# Patient Record
Sex: Male | Born: 1970
Health system: Southern US, Community
[De-identification: ages and names within clinical notes are randomized; demographics above are authoritative.]

## PROBLEM LIST (undated history)

## (undated) DIAGNOSIS — S31119A Laceration without foreign body of abdominal wall, unspecified quadrant without penetration into peritoneal cavity, initial encounter: Secondary | ICD-10-CM

## (undated) DIAGNOSIS — I2699 Other pulmonary embolism without acute cor pulmonale: Secondary | ICD-10-CM

## (undated) DIAGNOSIS — F1490 Cocaine use, unspecified, uncomplicated: Secondary | ICD-10-CM

## (undated) DIAGNOSIS — F1010 Alcohol abuse, uncomplicated: Secondary | ICD-10-CM

## (undated) HISTORY — PX: SKIN GRAFT: SHX250

---

## 2018-01-21 ENCOUNTER — Encounter (HOSPITAL_BASED_OUTPATIENT_CLINIC_OR_DEPARTMENT_OTHER): Payer: Self-pay | Admitting: Emergency Medicine

## 2018-01-21 ENCOUNTER — Emergency Department (HOSPITAL_BASED_OUTPATIENT_CLINIC_OR_DEPARTMENT_OTHER)
Admission: EM | Admit: 2018-01-21 | Discharge: 2018-01-21 | Disposition: A | Discharge status: Home / Self Care | Payer: Self-pay | Attending: Emergency Medicine | Admitting: Emergency Medicine

## 2018-01-21 ENCOUNTER — Other Ambulatory Visit: Payer: Self-pay

## 2018-01-21 ENCOUNTER — Emergency Department (HOSPITAL_BASED_OUTPATIENT_CLINIC_OR_DEPARTMENT_OTHER): Payer: Self-pay

## 2018-01-21 DIAGNOSIS — Z79899 Other long term (current) drug therapy: Secondary | ICD-10-CM | POA: Insufficient documentation

## 2018-01-21 DIAGNOSIS — R21 Rash and other nonspecific skin eruption: Secondary | ICD-10-CM | POA: Insufficient documentation

## 2018-01-21 DIAGNOSIS — F17210 Nicotine dependence, cigarettes, uncomplicated: Secondary | ICD-10-CM | POA: Insufficient documentation

## 2018-01-21 LAB — COMPREHENSIVE METABOLIC PANEL
ALT: 109 U/L — ABNORMAL HIGH (ref 17–63)
ANION GAP: 10 (ref 5–15)
AST: 63 U/L — ABNORMAL HIGH (ref 15–41)
Albumin: 3.5 g/dL (ref 3.5–5.0)
Alkaline Phosphatase: 228 U/L — ABNORMAL HIGH (ref 38–126)
BILIRUBIN TOTAL: 2.2 mg/dL — AB (ref 0.3–1.2)
BUN: 10 mg/dL (ref 6–20)
CO2: 22 mmol/L (ref 22–32)
Calcium: 9 mg/dL (ref 8.9–10.3)
Chloride: 99 mmol/L — ABNORMAL LOW (ref 101–111)
Creatinine, Ser: 0.84 mg/dL (ref 0.61–1.24)
GLUCOSE: 121 mg/dL — AB (ref 65–99)
POTASSIUM: 4.4 mmol/L (ref 3.5–5.1)
Sodium: 131 mmol/L — ABNORMAL LOW (ref 135–145)
TOTAL PROTEIN: 8.4 g/dL — AB (ref 6.5–8.1)

## 2018-01-21 LAB — CBC WITH DIFFERENTIAL/PLATELET
BASOS ABS: 0 10*3/uL (ref 0.0–0.1)
Basophils Relative: 0 %
EOS ABS: 0.4 10*3/uL (ref 0.0–0.7)
Eosinophils Relative: 4 %
HCT: 39.9 % (ref 39.0–52.0)
HEMOGLOBIN: 13.7 g/dL (ref 13.0–17.0)
LYMPHS PCT: 6 %
Lymphs Abs: 0.6 10*3/uL — ABNORMAL LOW (ref 0.7–4.0)
MCH: 32.1 pg (ref 26.0–34.0)
MCHC: 34.3 g/dL (ref 30.0–36.0)
MCV: 93.4 fL (ref 78.0–100.0)
MONOS PCT: 7 %
Monocytes Absolute: 0.7 10*3/uL (ref 0.1–1.0)
NEUTROS PCT: 83 %
Neutro Abs: 7.7 10*3/uL (ref 1.7–7.7)
Platelets: 173 10*3/uL (ref 150–400)
RBC: 4.27 MIL/uL (ref 4.22–5.81)
RDW: 13.7 % (ref 11.5–15.5)
WBC MORPHOLOGY: INCREASED
WBC: 9.4 10*3/uL (ref 4.0–10.5)

## 2018-01-21 MED ORDER — ACETAMINOPHEN 325 MG PO TABS
650.0000 mg | ORAL_TABLET | Freq: Once | ORAL | Status: AC
  Administered 2018-01-21: 650 mg via ORAL
  Filled 2018-01-21: qty 2
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
## 2018-01-21 NOTE — ED Provider Notes (Signed)
[facility] EMERGENCY DEPARTMENT Provider Note   CSN: [id number] Arrival date & time: 01/21/18  0915     History   Chief Complaint Chief Complaint  Patient presents with  . Rash    HPI Horatio PelJohn Waxman is a 47 y.o. male here for a rash  RASH  Had rash for 2 days. Location: throughout body, not on genitals, palms, or soles Medications tried: nothing Similar rash in past: no Patient believes may be caused by unsure He is currently staying at [facility].  Initially he thought that the rash and pain associated with it was because he was withdrawing.  New medications or antibiotics: yes, he is on antibiotics, he doesn't know which though.  Prescribed a 10-day course of antibiotics after getting laceration sutured on his head.  He is on day 9 of 10. Tick, Insect or new pet exposure: no Recent travel: no New detergent or soap: no Immunocompromised: no  Symptoms Itching: mild Pain over rash: yes Feeling ill all over: On Sunday he felt like his whole body hurt Fever: no Mouth sores: no Face or tongue swelling: no Trouble breathing: no Joint swelling or pain: no He is endorsing a cough  HPI  History reviewed. No pertinent past medical history.  There are no active problems to display for this patient.   History reviewed. No pertinent surgical history.      Home Medications    Prior to Admission medications   Medication Sig Start Date End Date Taking? Authorizing Provider  gabapentin (NEURONTIN) 100 MG capsule Take 100 mg by mouth 3 (three) times daily.   Yes [provider]  levETIRAcetam (KEPPRA) 500 MG tablet Take 500 mg by mouth 2 (two) times daily.   Yes [provider]  sulfamethoxazole-trimethoprim (BACTRIM DS,SEPTRA DS) 800-160 MG tablet Take 1 tablet by mouth 2 (two) times daily.   Yes [provider]  traZODone (DESYREL) 100 MG tablet Take 100 mg by mouth at bedtime.   Yes  [provider]    Family History History reviewed. No pertinent family history.  Social History Social History   Tobacco Use  . Smoking status: Current Every Day Smoker    Packs/day: 1.00    Types: Cigarettes  . Smokeless tobacco: Never Used  Substance Use Topics  . Alcohol use: Not on file  . Drug use: Not on file     Allergies   Patient has no known allergies.   Review of Systems Review of Systems  Constitutional: Negative for fatigue.  HENT: Negative for congestion and rhinorrhea.   Eyes: Negative for redness.  Respiratory: Negative for chest tightness, shortness of breath and wheezing.   Cardiovascular: Negative for chest pain.  Gastrointestinal: Negative for abdominal pain, constipation, diarrhea, nausea and vomiting.  Genitourinary: Negative for genital sores.  Musculoskeletal: Negative for arthralgias and joint swelling.  Skin: Positive for rash.    Physical Exam Updated Vital Signs BP 112/60 (BP Location: Right Arm)   Pulse 79   Temp 100 F (37.8 C) (Oral)   Resp 15   Ht 6\' 4"  (1.93 m)   Wt 90.7 kg (200 lb)   SpO2 100%   BMI 24.34 kg/m   Physical Exam  Constitutional: He is oriented to person, place, and time. He appears well-developed and well-nourished.  HENT:  Head: Normocephalic.  Right Ear: External ear normal.  Left Ear: External ear normal.  Nose: Nose normal.  Mouth/Throat: Oropharynx is clear and moist.  Eyes: Pupils are equal, round, and reactive to light. Conjunctivae are normal.  Neck: Normal range of motion.  Cardiovascular: Normal rate and regular rhythm.  Pulmonary/Chest: Effort normal and breath sounds normal.  Abdominal: Soft. Bowel sounds are normal.  Musculoskeletal: Normal range of motion. He exhibits no edema.  Neurological: He is alert and oriented to person, place, and time. He exhibits normal muscle tone.  Skin: Skin is warm and dry. Capillary refill takes less than 2 seconds. Rash noted. Rash is papular  (Scattered erythematous papules throughout body, most pronounced on head and torso.  No rash on palms or soles or mucous membranes).     ED Treatments / Results  Labs (all labs ordered are listed, but only abnormal results are displayed) Labs Reviewed  COMPREHENSIVE METABOLIC PANEL - Abnormal; Notable for the following components:      Result Value   Sodium 131 (*)    Chloride 99 (*)    Glucose, Bld 121 (*)    Total Protein 8.4 (*)    AST 63 (*)    ALT 109 (*)    Alkaline Phosphatase 228 (*)    Total Bilirubin 2.2 (*)    All other components within normal limits  CBC WITH DIFFERENTIAL/PLATELET - Abnormal; Notable for the following components:   Lymphs Abs 0.6 (*)    All other components within normal limits  HIV ANTIBODY (ROUTINE TESTING)  RPR  HEPATITIS PANEL, ACUTE    EKG None  Radiology Dg Chest 2 View  Result Date: 01/21/2018 CLINICAL DATA:  Cough.  Rash. EXAM: CHEST - 2 VIEW COMPARISON:  01/21/2018 FINDINGS: The heart size and mediastinal contours are within normal limits. Both lungs are clear except for slight peribronchial thickening on the lateral view. The visualized skeletal structures are unremarkable. IMPRESSION: Mild bronchitic changes. Electronically Signed   By: Francene Boyers M.D.   On: 01/21/2018 10:19    Procedures Procedures (including critical care time)  Medications Ordered in ED Medications  acetaminophen (TYLENOL) tablet 650 mg (650 mg Oral Given 01/21/18 1039)     Initial Impression / Assessment and Plan / ED Course  I have reviewed the triage vital signs and the nursing notes.  Pertinent labs & imaging results that were available during my care of the patient were reviewed by me and considered in my medical decision making (see chart for details).     47 year old male here for diffuse erythematous papular rash for 2 days. Possible low grade fever with a temperature of 100, otherwise vitals are normal. Could be a drug exanthem from being on  Bactrim however his rash is not a typical presentation of this. Rash does not appear to be involving the mucous membranes so no concern for Stevens-Johnson. Other possibilities include an infectious process; ?folliculitis vs HIV.  HIV, RPR, CBC, CMP collected.  CMP showing elevated LFTs and T bili, likely secondary to prior alcohol abuse.  Hepatitis panel collected.    Discussed with patient that his rash could be from drug exanthem and he will need to discontinue his Bactrim immediately.  He will take ibuprofen as needed for any pain.  Strict return precautions discussed.    Final Clinical Impressions(s) / ED Diagnoses   Final diagnoses:  Rash    ED Discharge Orders    None       Beaulah Dinning, MD 01/21/18 1244    Pricilla Loveless, MD 01/21/18 1525

## 2018-01-21 NOTE — Discharge Instructions (Addendum)
You were seen today for rash on your skin.  We are not 100% sure what is causing this rash however it is very likely that it could be a reaction to the antibiotics you are on.  You will need to stop the antibiotics immediately.  You can take ibuprofen as needed for any pain.  Please follow-up if the rash does not get better over the next few days, if you have a fever, chills, nausea, vomiting.

## 2018-01-22 ENCOUNTER — Encounter (HOSPITAL_COMMUNITY): Payer: Self-pay | Admitting: Emergency Medicine

## 2018-01-22 ENCOUNTER — Emergency Department (HOSPITAL_COMMUNITY)
Admission: EM | Admit: 2018-01-22 | Discharge: 2018-01-22 | Disposition: A | Discharge status: Home / Self Care | Payer: Self-pay | Attending: Emergency Medicine | Admitting: Emergency Medicine

## 2018-01-22 ENCOUNTER — Other Ambulatory Visit: Payer: Self-pay

## 2018-01-22 DIAGNOSIS — R21 Rash and other nonspecific skin eruption: Secondary | ICD-10-CM | POA: Insufficient documentation

## 2018-01-22 DIAGNOSIS — Z79899 Other long term (current) drug therapy: Secondary | ICD-10-CM | POA: Insufficient documentation

## 2018-01-22 DIAGNOSIS — F17210 Nicotine dependence, cigarettes, uncomplicated: Secondary | ICD-10-CM | POA: Insufficient documentation

## 2018-01-22 LAB — HIV ANTIBODY (ROUTINE TESTING W REFLEX): HIV SCREEN 4TH GENERATION: NONREACTIVE

## 2018-01-22 LAB — RPR: RPR Ser Ql: NONREACTIVE

## 2018-01-22 LAB — HEPATITIS PANEL, ACUTE
HCV Ab: <0.1 s/co ratio (ref 0.0–0.9)
HEP B C IGM: NEGATIVE
Hep A IgM: NEGATIVE
Hepatitis B Surface Ag: NEGATIVE

## 2018-01-22 MED ORDER — PREDNISONE 10 MG PO TABS: ORAL_TABLET | ORAL | 0 refills | Status: AC

## 2018-01-22 NOTE — ED Notes (Signed)
Dr. Wentz at bedside. 

## 2018-01-22 NOTE — ED Provider Notes (Signed)
[facility] EMERGENCY DEPARTMENT Provider Note   CSN: [id number] Arrival date & time: 01/22/18  1407     History   Chief Complaint Chief Complaint  Patient presents with  . Rash    HPI Scott Riley is a 47 y.o. male.  47 year old male presents with ongoing rash. Patient was seen at [facility] ER yesterday and was advised to discontinue the Bactrim he had been taking. Patient states discontinue the Bactrim and the rash continues to progress. Patient states the rash first started on Monday on his left lower leg, with mildly itchy at that time however has now spread to his entire body and is somewhat painful and less itchy at this time. Patient is currently undergoing a detox program with [facility] and is taking medications as directed. Denies fevers, abdominal pain, chest pain, throat swelling or pain. No known allergies, no other complaints or concerns.      History reviewed. No pertinent past medical history.  There are no active problems to display for this patient.   History reviewed. No pertinent surgical history.      Home Medications    Prior to Admission medications   Medication Sig Start Date End Date Taking? Authorizing Provider  gabapentin (NEURONTIN) 100 MG capsule Take 100 mg by mouth 3 (three) times daily.    [provider]  levETIRAcetam (KEPPRA) 500 MG tablet Take 500 mg by mouth 2 (two) times daily.    [provider]  predniSONE (DELTASONE) 10 MG tablet Take 6 tablets (60 mg total) by mouth daily for 1 day, THEN 5 tablets (50 mg total) daily for 1 day, THEN 4 tablets (40 mg total) daily for 1 day, THEN 3 tablets (30 mg total) daily for 1 day, THEN 2 tablets (20 mg total) daily for 1 day. 01/22/18 01/27/18  Jeannie Fend, PA-C  traZODone (DESYREL) 100 MG tablet Take 100 mg by mouth at bedtime.    [provider]    Family History History reviewed. No pertinent family history.  Social History Social  History   Tobacco Use  . Smoking status: Current Every Day Smoker    Packs/day: 1.00    Types: Cigarettes  . Smokeless tobacco: Never Used  Substance Use Topics  . Alcohol use: Not on file  . Drug use: Not on file     Allergies   Patient has no known allergies.   Review of Systems Review of Systems  Constitutional: Negative for chills and fever.  HENT: Negative for sore throat, trouble swallowing and voice change.   Eyes: Negative for redness and visual disturbance.  Respiratory: Negative for cough and shortness of breath.   Cardiovascular: Negative for chest pain.  Gastrointestinal: Negative for abdominal pain, constipation, diarrhea, nausea and vomiting.  Genitourinary: Negative for difficulty urinating.  Musculoskeletal: Negative for arthralgias, joint swelling and myalgias.  Skin: Positive for rash.  Allergic/Immunologic: Negative for immunocompromised state.  Hematological: Negative for adenopathy. Does not bruise/bleed easily.  Psychiatric/Behavioral: Negative for confusion.  All other systems reviewed and are negative.    Physical Exam Updated Vital Signs BP 135/81 (BP Location: Right Arm)   Pulse 91   Temp 98.2 F (36.8 C) (Oral)   Resp 18   SpO2 99%   Physical Exam  Constitutional: He is oriented to person, place, and time. He appears well-developed and well-nourished.  HENT:  Head: Normocephalic and atraumatic.  Cardiovascular: Normal rate, regular rhythm and normal heart sounds.  No murmur heard. Pulmonary/Chest: Effort  normal and breath sounds normal.  Neurological: He is alert and oriented to person, place, and time.  Skin: Skin is warm and dry. Capillary refill takes less than 2 seconds. Rash noted.     Psychiatric: He has a normal mood and affect. His behavior is normal.  Nursing note and vitals reviewed.    ED Treatments / Results  Labs (all labs ordered are listed, but only abnormal results are displayed) Labs Reviewed - No data to  display  EKG None  Radiology Dg Chest 2 View  Result Date: 01/21/2018 CLINICAL DATA:  Cough.  Rash. EXAM: CHEST - 2 VIEW COMPARISON:  01/21/2018 FINDINGS: The heart size and mediastinal contours are within normal limits. Both lungs are clear except for slight peribronchial thickening on the lateral view. The visualized skeletal structures are unremarkable. IMPRESSION: Mild bronchitic changes. Electronically Signed   By: Francene BoyersJames  Maxwell M.D.   On: 01/21/2018 10:19    Procedures Procedures (including critical care time)  Medications Ordered in ED Medications - No data to display   Initial Impression / Assessment and Plan / ED Course  I have reviewed the triage vital signs and the nursing notes.  Pertinent labs & imaging results that were available during my care of the patient were reviewed by me and considered in my medical decision making (see chart for details).  Clinical Course as of Jan 22 1809  Wed Jan 22, 2018  1808 Patient seen by Dr. Effie ShyWentz, recommends benadryl, pepcid, prednisone x 5 days, likely drug rash from the Septra.    [LM]  [id number] 47 yo male with rash onset Sunday or Monday, progressively worsening, seen in the ER yesterday and advised to stop the Septra, states the rash is worse today (lower eye lids). No mucous membrane involvement, not on the palms of hands or soles of feet.    [LM]    Clinical Course User Index [LM] Murphy, Laura A, PA-C      Final Clinical Impressions(s) / ED Diagnoses   Final diagnoses:  Rash    ED Discharge Orders        Ordered    predniSONE (DELTASONE) 10 MG tablet     06 /12/19 1742       Jeannie FendMurphy, Laura A, PA-C 01/22/18 1810    Mancel BaleWentz, Elliott, MD 01/23/18 1332

## 2018-01-22 NOTE — ED Notes (Signed)
PA at bedside.

## 2018-01-22 NOTE — ED Notes (Signed)
Pt had this RN call [facility] and inform them that he was being discharged.

## 2018-01-22 NOTE — ED Triage Notes (Signed)
Pt stated, I developed a rash on Monday morning. Given an antibiotic on Wed. For staples in head and I got this rash. I stopped the antibiotic , [facility] and not given anything and its gotten worse.  Rash all over my face and going into my eyes.

## 2018-01-22 NOTE — Discharge Instructions (Signed)
Take Benadryl and Pepcid as directed. Take prednisone taper Pak as prescribed and complete the full course. Return to the ER for any worsening or concerning symptoms.

## 2018-01-22 NOTE — ED Provider Notes (Signed)
  Face-to-face evaluation   History: Presents for evaluation of rash which started several days ago, after starting Septra for a wound infection of the scalp.  Evaluated yesterday at another ED and told to stop taking Septra, but no other treatments were initiated.  He is now here because of persistent rash, which is uncomfortable and somewhat pruritic.  He denies shortness of breath, cough, difficulty breathing, nausea, vomiting, chest pain, weakness or dizziness.  Physical exam: Alert, calm, cooperative.  No respiratory distress.  Skin with generalized red macular papular rash, varying appearance, some areas coalesced, some areas nodule like, sparing the lips and mouth.  MDM-rash has appearance of drug allergy, likely secondary to sulfa medication.  No evidence for significant internal infection, Stevens-Johnson syndrome, acute infectious process.  Plan-observation, treat with prednisone and and antihistamines.  Medical screening examination/treatment/procedure(s) were conducted as a shared visit with non-physician practitioner(s) and myself.  I personally evaluated the patient during the encounter    Mancel BaleWentz, Kaliope Quinonez, MD 01/23/18 [phone]

## 2018-02-11 ENCOUNTER — Other Ambulatory Visit: Payer: Self-pay

## 2018-02-11 ENCOUNTER — Emergency Department (HOSPITAL_BASED_OUTPATIENT_CLINIC_OR_DEPARTMENT_OTHER)
Admission: EM | Admit: 2018-02-11 | Discharge: 2018-02-11 | Disposition: A | Discharge status: Home / Self Care | Payer: Self-pay | Attending: Emergency Medicine | Admitting: Emergency Medicine

## 2018-02-11 ENCOUNTER — Encounter (HOSPITAL_BASED_OUTPATIENT_CLINIC_OR_DEPARTMENT_OTHER): Payer: Self-pay | Admitting: *Deleted

## 2018-02-11 DIAGNOSIS — Z79899 Other long term (current) drug therapy: Secondary | ICD-10-CM | POA: Insufficient documentation

## 2018-02-11 DIAGNOSIS — F17210 Nicotine dependence, cigarettes, uncomplicated: Secondary | ICD-10-CM | POA: Insufficient documentation

## 2018-02-11 DIAGNOSIS — H66001 Acute suppurative otitis media without spontaneous rupture of ear drum, right ear: Secondary | ICD-10-CM | POA: Insufficient documentation

## 2018-02-11 MED ORDER — IBUPROFEN 800 MG PO TABS
800.0000 mg | ORAL_TABLET | Freq: Once | ORAL | Status: AC
  Administered 2018-02-11: 800 mg via ORAL
  Filled 2018-02-11: qty 1

## 2018-02-11 MED ORDER — AMOXICILLIN-POT CLAVULANATE 875-125 MG PO TABS
1.0000 | ORAL_TABLET | Freq: Once | ORAL | Status: AC
  Administered 2018-02-11: 1 via ORAL
  Filled 2018-02-11: qty 1

## 2018-02-11 MED ORDER — AMOXICILLIN-POT CLAVULANATE 875-125 MG PO TABS: 1.0000 | ORAL_TABLET | Freq: Two times a day (BID) | ORAL | 0 refills | Status: AC

## 2018-02-11 NOTE — ED Provider Notes (Signed)
[facility] EMERGENCY DEPARTMENT Provider Note   CSN: [id number] Arrival date & time: 02/11/18  1321     History   Chief Complaint Chief Complaint  Patient presents with  . Otalgia    HPI Scott Riley is a 47 y.o. male.  The history is provided by the patient. No language interpreter was used.  Otalgia    Scott Riley is a 46 y.o. male who presents to the Emergency Department complaining of earache. He reports two weeks of drainage and pain to the right ear and one week of the drainage and pain to the left ear. No reports of injuries. He does not swim in lakes or pools. He does endorse decreased hearing and bilateral ears, left greater than right. No reports of fevers, dizziness, vomiting. He has a history of seizures, no additional medical problems. He is currently in treatment for substance abuse. History reviewed. No pertinent past medical history.  There are no active problems to display for this patient.   History reviewed. No pertinent surgical history.      Home Medications    Prior to Admission medications   Medication Sig Start Date End Date Taking? Authorizing Provider  amoxicillin-clavulanate (AUGMENTIN) 875-125 MG tablet Take 1 tablet by mouth every 12 (twelve) hours. 02/11/18   Tilden Fossa, MD  gabapentin (NEURONTIN) 100 MG capsule Take 100 mg by mouth 3 (three) times daily.    [provider]  levETIRAcetam (KEPPRA) 500 MG tablet Take 500 mg by mouth 2 (two) times daily.    [provider]  traZODone (DESYREL) 100 MG tablet Take 100 mg by mouth at bedtime.    [provider]    Family History History reviewed. No pertinent family history.  Social History Social History   Tobacco Use  . Smoking status: Current Every Day Smoker    Packs/day: 1.00    Types: Cigarettes  . Smokeless tobacco: Never Used  Substance Use Topics  . Alcohol use: Not on file  . Drug use: Not on file     Allergies   Smz-tmp ds  [sulfamethoxazole-trimethoprim]   Review of Systems Review of Systems  HENT: Positive for ear pain.   All other systems reviewed and are negative.    Physical Exam Updated Vital Signs BP 132/83 (BP Location: Left Arm)   Pulse 84   Temp 98.2 F (36.8 C) (Oral)   Resp 18   Ht 6\' 4"  (1.93 m)   Wt 90.7 kg (200 lb)   SpO2 98%   BMI 24.34 kg/m   Physical Exam  Constitutional: He is oriented to person, place, and time. He appears well-developed and well-nourished.  HENT:  Head: Normocephalic and atraumatic.  Mouth/Throat: Oropharynx is clear and moist.  Right TM with erythema, delight reflux. There is minimal erythema of the ear canal with mild swelling. There is no exudate in the right ear canal. No mastoid tenderness. Left TM is mildly erythematous with opacification in the inferior portion. There is mild erythema of the left ear canal. No mastoid tenderness.  Neck: Neck supple.  Cardiovascular: Normal rate and regular rhythm.  No murmur heard. Pulmonary/Chest: Effort normal and breath sounds normal. No respiratory distress.  Musculoskeletal: He exhibits no edema or tenderness.  Neurological: He is alert and oriented to person, place, and time.  Skin: Skin is warm and dry.  Psychiatric: He has a normal mood and affect. His behavior is normal.  Nursing note and vitals reviewed.    ED Treatments / Results  Labs (all labs ordered are listed, but only abnormal results are displayed) Labs Reviewed - No data to display  EKG None  Radiology No results found.  Procedures Procedures (including critical care time)  Medications Ordered in ED Medications  amoxicillin-clavulanate (AUGMENTIN) 875-125 MG per tablet 1 tablet (has no administration in time range)     Initial Impression / Assessment and Plan / ED Course  I have reviewed the triage vital signs and the nursing notes.  Pertinent labs & imaging results that were available during my care of the patient were  reviewed by me and considered in my medical decision making (see chart for details).     Patient here for evaluation of your pain. He is non-toxic appearing on examination. Exam on the right is concerning for acute otitis media. There is minimal erythema of the ear canals with no overt otitis externa. There is no evidence of mastoiditis. Left TM with possible scarring versus small perforation. Discussed with patient home care for otitis media. Discussed ENT follow-up as well as return precautions.  Final Clinical Impressions(s) / ED Diagnoses   Final diagnoses:  Non-recurrent acute suppurative otitis media of right ear without spontaneous rupture of tympanic membrane    ED Discharge Orders        Ordered    amoxicillin-clavulanate (AUGMENTIN) 875-125 MG tablet  Every 12 hours     02/11/18 1450       Tilden Fossaees, Nancyann Cotterman, MD 02/11/18 1454

## 2018-02-11 NOTE — ED Triage Notes (Signed)
Pt reports 2 weeks of right ear pain, now both ears have pain with drainage. Denies any fevers

## 2018-02-19 ENCOUNTER — Emergency Department (HOSPITAL_BASED_OUTPATIENT_CLINIC_OR_DEPARTMENT_OTHER)
Admission: EM | Admit: 2018-02-19 | Discharge: 2018-02-19 | Disposition: A | Discharge status: Home / Self Care | Payer: Self-pay | Attending: Emergency Medicine | Admitting: Emergency Medicine

## 2018-02-19 ENCOUNTER — Other Ambulatory Visit: Payer: Self-pay

## 2018-02-19 ENCOUNTER — Encounter (HOSPITAL_BASED_OUTPATIENT_CLINIC_OR_DEPARTMENT_OTHER): Payer: Self-pay

## 2018-02-19 DIAGNOSIS — H9201 Otalgia, right ear: Secondary | ICD-10-CM

## 2018-02-19 DIAGNOSIS — H9202 Otalgia, left ear: Secondary | ICD-10-CM

## 2018-02-19 DIAGNOSIS — H60501 Unspecified acute noninfective otitis externa, right ear: Secondary | ICD-10-CM | POA: Insufficient documentation

## 2018-02-19 DIAGNOSIS — Z79899 Other long term (current) drug therapy: Secondary | ICD-10-CM | POA: Insufficient documentation

## 2018-02-19 DIAGNOSIS — F17210 Nicotine dependence, cigarettes, uncomplicated: Secondary | ICD-10-CM | POA: Insufficient documentation

## 2018-02-19 HISTORY — DX: Alcohol abuse, uncomplicated: F10.10

## 2018-02-19 HISTORY — DX: Cocaine use, unspecified, uncomplicated: F14.90

## 2018-02-19 MED ORDER — CIPROFLOXACIN-DEXAMETHASONE 0.3-0.1 % OT SUSP
4.0000 [drp] | Freq: Two times a day (BID) | OTIC | Status: DC
  Administered 2018-02-19: 4 [drp] via OTIC
  Filled 2018-02-19: qty 7.5

## 2018-02-19 MED ORDER — CIPROFLOXACIN-DEXAMETHASONE 0.3-0.1 % OT SUSP: 4.0000 [drp] | Freq: Two times a day (BID) | OTIC | 0 refills | Status: AC

## 2018-02-19 MED ORDER — FLUTICASONE PROPIONATE 50 MCG/ACT NA SUSP: 2.0000 | Freq: Every day | NASAL | 0 refills | Status: AC

## 2018-02-19 MED ORDER — IBUPROFEN 800 MG PO TABS: 800.0000 mg | ORAL_TABLET | Freq: Three times a day (TID) | ORAL | 0 refills | Status: AC | PRN

## 2018-02-19 NOTE — ED Triage Notes (Signed)
C/o bialt ear pain and drainage-states he was seen here for same-completed abx and no better-NAD-steady gait[facility]

## 2018-02-19 NOTE — Discharge Instructions (Signed)
Use ear drops as directed until completed. Do not use Qtips or anything else in your ear. Do not submerge your head underwater. Alternate between tylenol and ibuprofen as directed as needed for pain. Use flonase and over the counter netipot sinus rinse to help with symptoms. Follow up with the ENT doctor in 1 week for recheck of symptoms. Return to the ER for emergent changes or worsening symptoms.

## 2018-02-19 NOTE — ED Notes (Signed)
Pt was taking Augmentin last dose this AM.

## 2018-02-19 NOTE — ED Provider Notes (Signed)
[facility] EMERGENCY DEPARTMENT Provider Note   CSN: [id number] Arrival date & time: 02/19/18  1326     History   Chief Complaint Chief Complaint  Patient presents with  . Otalgia    HPI Scott Riley is a 47 y.o. male with a PMHx of crack cocaine and EtOH use, who presents to the ED with complaints of ongoing b/l ear pain and drainage R>L.  Chart review reveals that he was seen in the ED on 02/11/18 for b/l ear pain/drainage (R x2wks, L x1wk), appeared to have AOM, no overt signs of AOE per notes, L TM had ?scarring vs perf, treated for AOM with augmentin x1wk and f/up with ENT.  Pt states he took his last dose of augmentin today but continues to have the ear pain.  He describes the pain as 5/10 intermittent sharp bilateral ear pain that radiates into his throat, worse on the right side, with no known aggravating factors, and somewhat improved with ibuprofen.  He states that he feels as though his ears are draining into his throat.  He also has a whitish drainage particularly from the right ear but occasionally from the left ear as well.  He feels a popping sensation in his left ear quite often which is aggravating.  He also feels as though his hearing is muffled in both ears.  He admits to using Q-tips.  He denies any recent swimming or underwater activities.  He has not followed up with the ENT because of insurance issues and being at [facility].  He denies any fevers, chills, rhinorrhea, drooling, trismus, cough, pain behind the ears, or any other complaints at this time.  The history is provided by the patient and medical records. No language interpreter was used.  Otalgia  Associated symptoms include ear discharge, hearing loss and sore throat. Pertinent negatives include no rhinorrhea and no cough.    Past Medical History:  Diagnosis Date  . Crack cocaine use   . ETOH abuse     There are no active problems to display for this patient.   Past Surgical History:  Procedure  Laterality Date  . SKIN GRAFT          Home Medications    Prior to Admission medications   Medication Sig Start Date End Date Taking? Authorizing Provider  amoxicillin-clavulanate (AUGMENTIN) 875-125 MG tablet Take 1 tablet by mouth every 12 (twelve) hours. 02/11/18   Tilden Fossa, MD  gabapentin (NEURONTIN) 100 MG capsule Take 100 mg by mouth 3 (three) times daily.    [provider]  levETIRAcetam (KEPPRA) 500 MG tablet Take 500 mg by mouth 2 (two) times daily.    [provider]  traZODone (DESYREL) 100 MG tablet Take 100 mg by mouth at bedtime.    [provider]    Family History No family history on file.  Social History Social History   Tobacco Use  . Smoking status: Current Every Day Smoker    Packs/day: 1.00    Types: Cigarettes  . Smokeless tobacco: Never Used  Substance Use Topics  . Alcohol use: Not on file    Comment: in rehab  . Drug use: Not on file    Comment: in rehab     Allergies   Smz-tmp ds [sulfamethoxazole-trimethoprim]   Review of Systems Review of Systems  Constitutional: Negative for chills and fever.  HENT: Positive for ear discharge, ear pain, hearing loss and sore throat. Negative for drooling, rhinorrhea and trouble swallowing.  Respiratory: Negative for cough.   Allergic/Immunologic: Negative for immunocompromised state.    Physical Exam Updated Vital Signs BP 130/82 (BP Location: Left Arm)   Pulse 84   Temp 98 F (36.7 C) (Oral)   Resp 18   Ht 6\' 4"  (1.93 m)   Wt 100.2 kg (221 lb)   SpO2 96%   BMI 26.90 kg/m   Physical Exam  Constitutional: He is oriented to person, place, and time. Vital signs are normal. He appears well-developed and well-nourished.  Non-toxic appearance. No distress.  Afebrile, nontoxic, NAD  HENT:  Head: Normocephalic and atraumatic.  Right Ear: Tympanic membrane and external ear normal. There is drainage, swelling and tenderness. No mastoid tenderness. Tympanic  membrane is not injected, not erythematous and not bulging. No middle ear effusion.  Left Ear: Tympanic membrane and external ear normal. There is tenderness. No drainage or swelling. No mastoid tenderness. Tympanic membrane is not injected, not erythematous and not bulging.  No middle ear effusion.  Nose: Nose normal.  Mouth/Throat: Uvula is midline, oropharynx is clear and moist and mucous membranes are normal. No trismus in the jaw. No uvula swelling. Tonsils are 0 on the right. Tonsils are 0 on the left. No tonsillar exudate.  R ear canal with mild swelling and exudate to superior aspect of ear canal, pain with pinna retraction, TM clear; L ear canal mildly erythematous and some slight tenderness with pinna retraction, no swelling or exudate, TM clear, has a few small hard cerumen flakes which were attempted to be removed but could not be, one flake has a whitish substance on it but doesn't look like exudate. No mastoid tenderness. TMs both intact and without perforation.  Nose clear. Oropharynx clear and moist, without uvular swelling or deviation, no trismus or drooling, no tonsillar swelling or erythema, no exudates.    Eyes: Conjunctivae and EOM are normal. Right eye exhibits no discharge. Left eye exhibits no discharge.  Neck: Normal range of motion. Neck supple.  Cardiovascular: Normal rate and intact distal pulses.  Pulmonary/Chest: Effort normal. No respiratory distress.  Abdominal: Normal appearance. He exhibits no distension.  Musculoskeletal: Normal range of motion.  Lymphadenopathy:       Head (right side): No submandibular and no tonsillar adenopathy present.       Head (left side): No submandibular and no tonsillar adenopathy present.    He has no cervical adenopathy.  No head/neck LAD  Neurological: He is alert and oriented to person, place, and time. He has normal strength. No sensory deficit.  Skin: Skin is warm, dry and intact. No rash noted.  Psychiatric: He has a normal  mood and affect.  Nursing note and vitals reviewed.    ED Treatments / Results  Labs (all labs ordered are listed, but only abnormal results are displayed) Labs Reviewed - No data to display  EKG None  Radiology No results found.  Procedures Procedures (including critical care time)  Medications Ordered in ED Medications  ciprofloxacin-dexamethasone (CIPRODEX) 0.3-0.1 % OTIC (EAR) suspension 4 drop (4 drops Both EARS Given 02/19/18 1517)     Initial Impression / Assessment and Plan / ED Course  I have reviewed the triage vital signs and the nursing notes.  Pertinent labs & imaging results that were available during my care of the patient were reviewed by me and considered in my medical decision making (see chart for details).     47 y.o. male here with continued ear pain and drainage from both ears, primarily R  ear. On exam, R ear canal with mild swelling and exudate to superior aspect of ear canal, pain with pinna retraction, TM clear; L ear canal mildly erythematous with no swelling or exudate, TM clear, has a few small hard cerumen flakes which were attempted to be removed but could not be, one flake has a whitish substance on it but doesn't look like exudate. No mastoid tenderness. Throat clear. Overall, likely AOE of R ear, and possibly slight AOE of L ear. Doubt need for repeat PO abx, will send home with ciprodex, advised use of flonase to help with encouraging drainage of ears, discussed tylenol/ibuprofen use, no underwater activities and no qtip use, and f/up with ENT in 1wk for recheck. I explained the diagnosis and have given explicit precautions to return to the ER including for any other new or worsening symptoms. The patient understands and accepts the medical plan as it's been dictated and I have answered their questions. Discharge instructions concerning home care and prescriptions have been given. The patient is STABLE and is discharged to home in good condition.     Final Clinical Impressions(s) / ED Diagnoses   Final diagnoses:  Acute otitis externa of right ear, unspecified type  Otalgia of right ear  Otalgia of left ear    ED Discharge Orders        Ordered    ciprofloxacin-dexamethasone (CIPRODEX) OTIC suspension  2 times daily     02/19/18 1518    fluticasone (FLONASE) 50 MCG/ACT nasal spray  Daily     02/19/18 1518    ibuprofen (ADVIL,MOTRIN) 800 MG tablet  Every 8 hours PRN     02/19/18 [address], [place], [place] 02/19/18 1526    Vanetta Mulders, MD 02/19/18 1527

## 2018-02-19 NOTE — ED Notes (Signed)
Pt reports he was seen here and referred to specialist- pt is at [facility] and does not have insurance- unable to follow up. Pt reports fullness has gotten worse and his neck now feels swollen. Denies fevers.

## 2018-07-13 DIAGNOSIS — I2699 Other pulmonary embolism without acute cor pulmonale: Secondary | ICD-10-CM

## 2018-07-13 DIAGNOSIS — S31119A Laceration without foreign body of abdominal wall, unspecified quadrant without penetration into peritoneal cavity, initial encounter: Secondary | ICD-10-CM

## 2018-07-13 HISTORY — DX: Other pulmonary embolism without acute cor pulmonale: I26.99

## 2018-07-13 HISTORY — DX: Laceration without foreign body of abdominal wall, unspecified quadrant without penetration into peritoneal cavity, initial encounter: S31.119A

## 2018-07-19 ENCOUNTER — Encounter (HOSPITAL_COMMUNITY): Admission: EM | Disposition: A | Discharge status: Home / Self Care | Payer: Self-pay | Source: Home / Self Care

## 2018-07-19 ENCOUNTER — Inpatient Hospital Stay (HOSPITAL_COMMUNITY)
Admission: EM | Admit: 2018-07-19 | Discharge: 2018-07-24 | DRG: 579 | Disposition: A | Discharge status: Home / Self Care | Payer: Self-pay | Attending: General Surgery | Admitting: General Surgery

## 2018-07-19 DIAGNOSIS — K659 Peritonitis, unspecified: Secondary | ICD-10-CM | POA: Diagnosis present

## 2018-07-19 DIAGNOSIS — T148XXA Other injury of unspecified body region, initial encounter: Secondary | ICD-10-CM

## 2018-07-19 DIAGNOSIS — R140 Abdominal distension (gaseous): Secondary | ICD-10-CM

## 2018-07-19 DIAGNOSIS — Z9889 Other specified postprocedural states: Secondary | ICD-10-CM

## 2018-07-19 DIAGNOSIS — F1990 Other psychoactive substance use, unspecified, uncomplicated: Secondary | ICD-10-CM | POA: Diagnosis present

## 2018-07-19 DIAGNOSIS — Y906 Blood alcohol level of 120-199 mg/100 ml: Secondary | ICD-10-CM | POA: Diagnosis present

## 2018-07-19 DIAGNOSIS — Z590 Homelessness: Secondary | ICD-10-CM

## 2018-07-19 DIAGNOSIS — F10120 Alcohol abuse with intoxication, uncomplicated: Secondary | ICD-10-CM | POA: Diagnosis present

## 2018-07-19 DIAGNOSIS — Z882 Allergy status to sulfonamides status: Secondary | ICD-10-CM

## 2018-07-19 DIAGNOSIS — F17200 Nicotine dependence, unspecified, uncomplicated: Secondary | ICD-10-CM | POA: Diagnosis present

## 2018-07-19 DIAGNOSIS — S0012XA Contusion of left eyelid and periocular area, initial encounter: Secondary | ICD-10-CM | POA: Diagnosis present

## 2018-07-19 DIAGNOSIS — Z9289 Personal history of other medical treatment: Secondary | ICD-10-CM

## 2018-07-19 DIAGNOSIS — Z6829 Body mass index (BMI) 29.0-29.9, adult: Secondary | ICD-10-CM

## 2018-07-19 DIAGNOSIS — F10920 Alcohol use, unspecified with intoxication, uncomplicated: Secondary | ICD-10-CM

## 2018-07-19 DIAGNOSIS — J45909 Unspecified asthma, uncomplicated: Secondary | ICD-10-CM | POA: Diagnosis present

## 2018-07-19 DIAGNOSIS — S21112A Laceration without foreign body of left front wall of thorax without penetration into thoracic cavity, initial encounter: Secondary | ICD-10-CM | POA: Diagnosis present

## 2018-07-19 DIAGNOSIS — S31119A Laceration without foreign body of abdominal wall, unspecified quadrant without penetration into peritoneal cavity, initial encounter: Secondary | ICD-10-CM | POA: Diagnosis present

## 2018-07-19 DIAGNOSIS — R109 Unspecified abdominal pain: Secondary | ICD-10-CM

## 2018-07-19 DIAGNOSIS — E669 Obesity, unspecified: Secondary | ICD-10-CM | POA: Diagnosis present

## 2018-07-19 DIAGNOSIS — W269XXA Contact with unspecified sharp object(s), initial encounter: Secondary | ICD-10-CM

## 2018-07-19 DIAGNOSIS — S31111A Laceration without foreign body of abdominal wall, left upper quadrant without penetration into peritoneal cavity, initial encounter: Principal | ICD-10-CM | POA: Diagnosis present

## 2018-07-19 DIAGNOSIS — S36113A Laceration of liver, unspecified degree, initial encounter: Secondary | ICD-10-CM | POA: Diagnosis present

## 2018-07-19 HISTORY — PX: LAPAROTOMY: SHX154

## 2018-07-19 SURGERY — LAPAROTOMY, EXPLORATORY
Anesthesia: General | Site: Abdomen

## 2018-07-19 MED ORDER — FENTANYL CITRATE (PF) 100 MCG/2ML IJ SOLN
INTRAMUSCULAR | Status: AC | PRN
  Administered 2018-07-19: 50 ug via INTRAVENOUS

## 2018-07-19 MED ORDER — PROPOFOL 10 MG/ML IV BOLUS
INTRAVENOUS | Status: AC
  Filled 2018-07-19: qty 20

## 2018-07-19 MED ORDER — MIDAZOLAM HCL 2 MG/2ML IJ SOLN
INTRAMUSCULAR | Status: AC
  Filled 2018-07-19: qty 2

## 2018-07-19 MED ORDER — SUFENTANIL CITRATE 50 MCG/ML IV SOLN
INTRAVENOUS | Status: AC
  Filled 2018-07-19: qty 1

## 2018-07-19 MED ORDER — SUCCINYLCHOLINE CHLORIDE 20 MG/ML IJ SOLN
INTRAMUSCULAR | Status: AC | PRN
  Administered 2018-07-19: 100 mg via INTRAVENOUS

## 2018-07-19 MED ORDER — ETOMIDATE 2 MG/ML IV SOLN
INTRAVENOUS | Status: AC | PRN
  Administered 2018-07-19: 25 mg via INTRAVENOUS

## 2018-07-19 MED ORDER — SODIUM CHLORIDE 0.9 % IV SOLN
INTRAVENOUS | Status: AC | PRN
  Administered 2018-07-19: 1000 mL via INTRAVENOUS

## 2018-07-19 MED ORDER — MIDAZOLAM HCL 5 MG/5ML IJ SOLN
INTRAMUSCULAR | Status: AC | PRN
  Administered 2018-07-19 (×2): 2 mg via INTRAVENOUS

## 2018-07-19 SURGICAL SUPPLY — 45 items
BLADE CLIPPER SURG (BLADE) IMPLANT
CANISTER SUCT 3000ML PPV (MISCELLANEOUS) ×3 IMPLANT
CHLORAPREP W/TINT 26ML (MISCELLANEOUS) ×3 IMPLANT
COVER SURGICAL LIGHT HANDLE (MISCELLANEOUS) ×3 IMPLANT
COVER WAND RF STERILE (DRAPES) ×3 IMPLANT
DRAPE LAPAROSCOPIC ABDOMINAL (DRAPES) ×3 IMPLANT
DRAPE WARM FLUID 44X44 (DRAPE) ×3 IMPLANT
DRSG OPSITE POSTOP 3X4 (GAUZE/BANDAGES/DRESSINGS) ×6 IMPLANT
DRSG OPSITE POSTOP 4X10 (GAUZE/BANDAGES/DRESSINGS) IMPLANT
DRSG OPSITE POSTOP 4X8 (GAUZE/BANDAGES/DRESSINGS) ×3 IMPLANT
ELECT BLADE 6.5 EXT (BLADE) ×3 IMPLANT
ELECT CAUTERY BLADE 6.4 (BLADE) ×3 IMPLANT
ELECT REM PT RETURN 9FT ADLT (ELECTROSURGICAL) ×3
ELECTRODE REM PT RTRN 9FT ADLT (ELECTROSURGICAL) ×1 IMPLANT
GLOVE BIO SURGEON STRL SZ7 (GLOVE) ×3 IMPLANT
GLOVE BIOGEL PI IND STRL 7.0 (GLOVE) ×1 IMPLANT
GLOVE BIOGEL PI IND STRL 7.5 (GLOVE) ×1 IMPLANT
GLOVE BIOGEL PI INDICATOR 7.0 (GLOVE) ×2
GLOVE BIOGEL PI INDICATOR 7.5 (GLOVE) ×2
GLOVE SURG SS PI 7.0 STRL IVOR (GLOVE) ×6 IMPLANT
GOWN STRL REUS W/ TWL LRG LVL3 (GOWN DISPOSABLE) ×2 IMPLANT
GOWN STRL REUS W/TWL LRG LVL3 (GOWN DISPOSABLE) ×4
KIT BASIN OR (CUSTOM PROCEDURE TRAY) ×3 IMPLANT
KIT TURNOVER KIT B (KITS) ×3 IMPLANT
LIGASURE IMPACT 36 18CM CVD LR (INSTRUMENTS) IMPLANT
NS IRRIG 1000ML POUR BTL (IV SOLUTION) ×6 IMPLANT
PACK GENERAL/GYN (CUSTOM PROCEDURE TRAY) ×3 IMPLANT
PAD ARMBOARD 7.5X6 YLW CONV (MISCELLANEOUS) ×3 IMPLANT
SPECIMEN JAR LARGE (MISCELLANEOUS) IMPLANT
SPONGE LAP 18X18 X RAY DECT (DISPOSABLE) ×3 IMPLANT
STAPLER VISISTAT 35W (STAPLE) ×3 IMPLANT
SUCTION POOLE TIP (SUCTIONS) ×3 IMPLANT
SUT PDS AB 1 TP1 96 (SUTURE) ×6 IMPLANT
SUT SILK 2 0 (SUTURE) ×2
SUT SILK 2 0 SH CR/8 (SUTURE) ×3 IMPLANT
SUT SILK 2-0 18XBRD TIE 12 (SUTURE) ×1 IMPLANT
SUT SILK 3 0 (SUTURE) ×2
SUT SILK 3 0 SH CR/8 (SUTURE) ×3 IMPLANT
SUT SILK 3-0 18XBRD TIE 12 (SUTURE) ×1 IMPLANT
SUT VIC AB 3-0 SH 27 (SUTURE)
SUT VIC AB 3-0 SH 27X BRD (SUTURE) IMPLANT
TOWEL GREEN STERILE FF (TOWEL DISPOSABLE) ×3 IMPLANT
TOWEL OR 17X26 10 PK STRL BLUE (TOWEL DISPOSABLE) ×3 IMPLANT
TRAY FOLEY MTR SLVR 16FR STAT (SET/KITS/TRAYS/PACK) ×3 IMPLANT
YANKAUER SUCT BULB TIP NO VENT (SUCTIONS) ×3 IMPLANT

## 2018-07-19 NOTE — ED Provider Notes (Signed)
INTUBATION Performed by: Joaquin CourtsSarah K Tanice Petre  Required items: required blood products, implants, devices, and special equipment available Patient identity confirmed: provided demographic data and hospital-assigned identification number  Indications: Respiratory distress  Intubation method: Glidescope Laryngoscopy   Preoxygenation: BVM  Sedatives: Etomidate Paralytic: Succinylcholine  Tube Size: 7.5 cuffed  Post-procedure assessment: chest rise and ETCO2 monitor Breath sounds: equal and absent over the epigastrium Tube secured with: ETT holder Chest x-ray interpreted by radiologist and me.  Chest x-ray findings: endotracheal tube in appropriate position  Patient tolerated the procedure well with no immediate complications. Taken directly to the OR.       Joaquin CourtsWendel, Darshan Solanki K, MD 07/19/18 Arletha [id number]    Dione BoozeGlick, David, MD 07/19/18 Dorna [id number]    Dione BoozeGlick, David, MD 07/20/18 [phone]
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
## 2018-07-20 ENCOUNTER — Inpatient Hospital Stay (HOSPITAL_COMMUNITY): Payer: Self-pay

## 2018-07-20 ENCOUNTER — Emergency Department (HOSPITAL_COMMUNITY): Payer: Self-pay | Admitting: Certified Registered"

## 2018-07-20 ENCOUNTER — Emergency Department (HOSPITAL_COMMUNITY): Payer: Self-pay

## 2018-07-20 DIAGNOSIS — S31119A Laceration without foreign body of abdominal wall, unspecified quadrant without penetration into peritoneal cavity, initial encounter: Secondary | ICD-10-CM | POA: Diagnosis present

## 2018-07-20 DIAGNOSIS — Z9889 Other specified postprocedural states: Secondary | ICD-10-CM

## 2018-07-20 LAB — POCT I-STAT 3, ART BLOOD GAS (G3+)
Acid-base deficit: 5 mmol/L — ABNORMAL HIGH (ref 0.0–2.0)
Bicarbonate: 21 mmol/L (ref 20.0–28.0)
O2 Saturation: 100 %
PO2 ART: 319 mmHg — AB (ref 83.0–108.0)
Patient temperature: 97.6
TCO2: 22 mmol/L (ref 22–32)
pCO2 arterial: 40.9 mmHg (ref 32.0–48.0)
pH, Arterial: 7.317 — ABNORMAL LOW (ref 7.350–7.450)

## 2018-07-20 LAB — BASIC METABOLIC PANEL
Anion gap: 11 (ref 5–15)
BUN: 9 mg/dL (ref 6–20)
CO2: 22 mmol/L (ref 22–32)
Calcium: 8.2 mg/dL — ABNORMAL LOW (ref 8.9–10.3)
Chloride: 105 mmol/L (ref 98–111)
Creatinine, Ser: 0.89 mg/dL (ref 0.61–1.24)
GFR calc Af Amer: >60 mL/min (ref >60)
GFR calc non Af Amer: >60 mL/min (ref >60)
Glucose, Bld: 93 mg/dL (ref 70–99)
Potassium: 3.7 mmol/L (ref 3.5–5.1)
Sodium: 138 mmol/L (ref 135–145)

## 2018-07-20 LAB — RAPID URINE DRUG SCREEN, HOSP PERFORMED
Amphetamines: NOT DETECTED
Barbiturates: NOT DETECTED
Benzodiazepines: POSITIVE — AB
COCAINE: POSITIVE — AB
Opiates: NOT DETECTED
Tetrahydrocannabinol: POSITIVE — AB

## 2018-07-20 LAB — CBC
HCT: 44 % (ref 39.0–52.0)
Hemoglobin: 14 g/dL (ref 13.0–17.0)
MCH: 30.4 pg (ref 26.0–34.0)
MCHC: 31.8 g/dL (ref 30.0–36.0)
MCV: 95.7 fL (ref 80.0–100.0)
Platelets: 238 10*3/uL (ref 150–400)
RBC: 4.6 MIL/uL (ref 4.22–5.81)
RDW: 13.4 % (ref 11.5–15.5)
WBC: 9.4 10*3/uL (ref 4.0–10.5)
nRBC: 0 % (ref 0.0–0.2)

## 2018-07-20 LAB — BPAM FFP
BLOOD PRODUCT EXPIRATION DATE: 201912252359
Blood Product Expiration Date: 201912252359
ISSUE DATE / TIME: 201912072324
ISSUE DATE / TIME: 201912072324
Unit Type and Rh: 6200
Unit Type and Rh: 6200

## 2018-07-20 LAB — TRIGLYCERIDES: Triglycerides: 37 mg/dL (ref <150)

## 2018-07-20 LAB — PREPARE FRESH FROZEN PLASMA
UNIT DIVISION: 0
Unit division: 0

## 2018-07-20 LAB — HIV ANTIBODY (ROUTINE TESTING W REFLEX): HIV SCREEN 4TH GENERATION: NONREACTIVE

## 2018-07-20 LAB — ETHANOL: Alcohol, Ethyl (B): 177 mg/dL — ABNORMAL HIGH (ref <10)

## 2018-07-20 LAB — LACTIC ACID, PLASMA: Lactic Acid, Venous: 1.4 mmol/L (ref 0.5–1.9)

## 2018-07-20 LAB — MRSA PCR SCREENING: MRSA by PCR: POSITIVE — AB

## 2018-07-20 LAB — ABO/RH: ABO/RH(D): A POS

## 2018-07-20 MED ORDER — FENTANYL CITRATE (PF) 100 MCG/2ML IJ SOLN
100.0000 ug | INTRAMUSCULAR | Status: AC | PRN
  Administered 2018-07-20 (×3): 100 ug via INTRAVENOUS
  Filled 2018-07-20 (×2): qty 2

## 2018-07-20 MED ORDER — PROPOFOL 10 MG/ML IV BOLUS
INTRAVENOUS | Status: DC | PRN
  Administered 2018-07-20: 50 mg via INTRAVENOUS

## 2018-07-20 MED ORDER — MUPIROCIN 2 % EX OINT
1.0000 "application " | TOPICAL_OINTMENT | Freq: Two times a day (BID) | CUTANEOUS | Status: DC
  Administered 2018-07-20 – 2018-07-24 (×9): 1 via NASAL
  Filled 2018-07-20: qty 22

## 2018-07-20 MED ORDER — PROPOFOL 1000 MG/100ML IV EMUL
INTRAVENOUS | Status: AC
  Filled 2018-07-20: qty 100

## 2018-07-20 MED ORDER — HYDRALAZINE HCL 20 MG/ML IJ SOLN
10.0000 mg | INTRAMUSCULAR | Status: DC | PRN
  Filled 2018-07-20: qty 1

## 2018-07-20 MED ORDER — LACTATED RINGERS IV SOLN
INTRAVENOUS | Status: DC | PRN
  Administered 2018-07-20 (×2): via INTRAVENOUS

## 2018-07-20 MED ORDER — ACETAMINOPHEN 325 MG PO TABS
650.0000 mg | ORAL_TABLET | Freq: Four times a day (QID) | ORAL | Status: DC
  Administered 2018-07-20 – 2018-07-21 (×3): 650 mg via ORAL
  Filled 2018-07-20 (×3): qty 2

## 2018-07-20 MED ORDER — DOCUSATE SODIUM 100 MG PO CAPS
100.0000 mg | ORAL_CAPSULE | Freq: Two times a day (BID) | ORAL | Status: DC
  Administered 2018-07-20 – 2018-07-24 (×8): 100 mg via ORAL
  Filled 2018-07-20 (×8): qty 1

## 2018-07-20 MED ORDER — 0.9 % SODIUM CHLORIDE (POUR BTL) OPTIME
TOPICAL | Status: DC | PRN
  Administered 2018-07-20 (×2): 1000 mL

## 2018-07-20 MED ORDER — CHLORHEXIDINE GLUCONATE CLOTH 2 % EX PADS
6.0000 | MEDICATED_PAD | Freq: Every day | CUTANEOUS | Status: DC
  Administered 2018-07-22 – 2018-07-24 (×3): 6 via TOPICAL

## 2018-07-20 MED ORDER — MIDAZOLAM HCL 2 MG/2ML IJ SOLN
1.0000 mg | INTRAMUSCULAR | Status: DC | PRN
  Administered 2018-07-20 (×2): 2 mg via INTRAVENOUS
  Filled 2018-07-20 (×2): qty 2

## 2018-07-20 MED ORDER — SODIUM CHLORIDE 0.9 % IV SOLN
INTRAVENOUS | Status: DC
  Administered 2018-07-20 – 2018-07-22 (×5): via INTRAVENOUS

## 2018-07-20 MED ORDER — PROPOFOL 1000 MG/100ML IV EMUL
0.0000 ug/kg/min | INTRAVENOUS | Status: DC
  Administered 2018-07-20: 5 ug/kg/min via INTRAVENOUS

## 2018-07-20 MED ORDER — ONDANSETRON 4 MG PO TBDP: 4.0000 mg | ORAL_TABLET | Freq: Four times a day (QID) | ORAL | Status: DC | PRN

## 2018-07-20 MED ORDER — CEFAZOLIN SODIUM-DEXTROSE 2-3 GM-%(50ML) IV SOLR
INTRAVENOUS | Status: DC | PRN
  Administered 2018-07-20: 2 g via INTRAVENOUS

## 2018-07-20 MED ORDER — ORAL CARE MOUTH RINSE
15.0000 mL | Freq: Two times a day (BID) | OROMUCOSAL | Status: DC
  Administered 2018-07-20 – 2018-07-22 (×4): 15 mL via OROMUCOSAL

## 2018-07-20 MED ORDER — ORAL CARE MOUTH RINSE
15.0000 mL | OROMUCOSAL | Status: DC
  Administered 2018-07-20 (×5): 15 mL via OROMUCOSAL

## 2018-07-20 MED ORDER — SUFENTANIL CITRATE 50 MCG/ML IV SOLN
INTRAVENOUS | Status: DC | PRN
  Administered 2018-07-20: 10 ug via INTRAVENOUS
  Administered 2018-07-20 (×2): 20 ug via INTRAVENOUS

## 2018-07-20 MED ORDER — ADULT MULTIVITAMIN W/MINERALS CH
1.0000 | ORAL_TABLET | Freq: Every day | ORAL | Status: DC
  Administered 2018-07-20 – 2018-07-24 (×5): 1 via ORAL
  Filled 2018-07-20 (×5): qty 1

## 2018-07-20 MED ORDER — OXYCODONE HCL 5 MG PO TABS
5.0000 mg | ORAL_TABLET | ORAL | Status: DC | PRN
  Administered 2018-07-20 – 2018-07-21 (×3): 5 mg via ORAL
  Filled 2018-07-20 (×3): qty 1

## 2018-07-20 MED ORDER — ROCURONIUM 10MG/ML (10ML) SYRINGE FOR MEDFUSION PUMP - OPTIME
INTRAVENOUS | Status: DC | PRN
  Administered 2018-07-20: 50 mg via INTRAVENOUS

## 2018-07-20 MED ORDER — METHOCARBAMOL 500 MG PO TABS
500.0000 mg | ORAL_TABLET | Freq: Four times a day (QID) | ORAL | Status: DC | PRN
  Administered 2018-07-21 – 2018-07-24 (×6): 500 mg via ORAL
  Filled 2018-07-20 (×6): qty 1

## 2018-07-20 MED ORDER — ONDANSETRON HCL 4 MG/2ML IJ SOLN: 4.0000 mg | Freq: Four times a day (QID) | INTRAMUSCULAR | Status: DC | PRN

## 2018-07-20 MED ORDER — FENTANYL CITRATE (PF) 100 MCG/2ML IJ SOLN
INTRAMUSCULAR | Status: AC
  Filled 2018-07-20: qty 2

## 2018-07-20 MED ORDER — FENTANYL CITRATE (PF) 100 MCG/2ML IJ SOLN
100.0000 ug | INTRAMUSCULAR | Status: DC | PRN
  Administered 2018-07-20 (×3): 100 ug via INTRAVENOUS
  Filled 2018-07-20 (×3): qty 2

## 2018-07-20 MED ORDER — VITAMIN B-1 100 MG PO TABS
100.0000 mg | ORAL_TABLET | Freq: Every day | ORAL | Status: DC
  Administered 2018-07-20 – 2018-07-24 (×5): 100 mg via ORAL
  Filled 2018-07-20 (×5): qty 1

## 2018-07-20 MED ORDER — CHLORHEXIDINE GLUCONATE 0.12% ORAL RINSE (MEDLINE KIT)
15.0000 mL | Freq: Two times a day (BID) | OROMUCOSAL | Status: DC
  Administered 2018-07-20 (×2): 15 mL via OROMUCOSAL

## 2018-07-20 MED ORDER — DEXMEDETOMIDINE HCL IN NACL 200 MCG/50ML IV SOLN
0.2000 ug/kg/h | INTRAVENOUS | Status: DC
  Administered 2018-07-20: 0.5 ug/kg/h via INTRAVENOUS
  Administered 2018-07-20 (×3): 0.4 ug/kg/h via INTRAVENOUS
  Administered 2018-07-20: 0.2 ug/kg/h via INTRAVENOUS
  Filled 2018-07-20 (×5): qty 50

## 2018-07-20 MED ORDER — MIDAZOLAM HCL 2 MG/2ML IJ SOLN
INTRAMUSCULAR | Status: DC | PRN
  Administered 2018-07-20: 2 mg via INTRAVENOUS

## 2018-07-20 MED ORDER — ENOXAPARIN SODIUM 40 MG/0.4ML ~~LOC~~ SOLN
40.0000 mg | SUBCUTANEOUS | Status: DC
  Administered 2018-07-21 – 2018-07-23 (×3): 40 mg via SUBCUTANEOUS
  Filled 2018-07-20 (×3): qty 0.4

## 2018-07-20 MED ORDER — FOLIC ACID 1 MG PO TABS
1.0000 mg | ORAL_TABLET | Freq: Every day | ORAL | Status: DC
  Administered 2018-07-20 – 2018-07-24 (×5): 1 mg via ORAL
  Filled 2018-07-20 (×5): qty 1

## 2018-07-20 NOTE — Transfer of Care (Signed)
Immediate Anesthesia Transfer of Care Note  Patient: Scott Riley  Procedure(s) Performed: EXPLORATORY LAPAROTOMY (N/A Abdomen)  Patient Location: ICU  Anesthesia Type:General  Level of Consciousness: oriented and Patient remains intubated per anesthesia plan  Airway & Oxygen Therapy: Patient remains intubated per anesthesia plan and Patient placed on Ventilator (see vital sign flow sheet for setting)  Post-op Assessment: Report given to RN and Post -op Vital signs reviewed and stable  Post vital signs: Reviewed and stable  Last Vitals:  Vitals Value Taken Time  BP 130/89 07/20/2018  1:20 AM  Temp    Pulse 74 07/20/2018  1:23 AM  Resp 17 07/20/2018  1:23 AM  SpO2 100 % 07/20/2018  1:23 AM  Vitals shown include unvalidated device data.  Last Pain:  Vitals:   07/19/18 2344  TempSrc:   PainSc: 8          Complications: No apparent anesthesia complications

## 2018-07-20 NOTE — Progress Notes (Signed)
Pt transported to OR without complications
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
## 2018-07-20 NOTE — Procedures (Signed)
Extubation Procedure Note  Patient Details:   Name: Scott Riley DOB: Aug 16, 1970 MRN: [id number]   Airway Documentation:    Vent end date: 07/20/18 Vent end time: 1625   Evaluation  O2 sats: stable throughout Complications: No apparent complications Patient did tolerate procedure well. Bilateral Breath Sounds: Clear, Diminished   Yes   Pt extubated to 4L N/C.  No stridor noted.  RN @ bedside.  Christophe LouisSteven D Tinika Bucknam 07/20/2018, 4:31 PM
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
## 2018-07-20 NOTE — Progress Notes (Signed)
Pt transported to and from CT without event.  
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
## 2018-07-20 NOTE — Progress Notes (Signed)
  Fi02 dropped to 40% per ABG results.  
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
## 2018-07-20 NOTE — Plan of Care (Signed)
  Problem: Clinical Measurements: Goal: Ability to maintain clinical measurements within normal limits will improve Outcome: Progressing   

## 2018-07-21 ENCOUNTER — Encounter (HOSPITAL_COMMUNITY): Payer: Self-pay | Admitting: General Surgery

## 2018-07-21 LAB — BPAM RBC
BLOOD PRODUCT EXPIRATION DATE: 201912082359
Blood Product Expiration Date: 201912072359
ISSUE DATE / TIME: 201912072323
ISSUE DATE / TIME: 201912081455
Unit Type and Rh: 9500
Unit Type and Rh: 9500

## 2018-07-21 LAB — TYPE AND SCREEN
ABO/RH(D): A POS
Antibody Screen: NEGATIVE
Unit division: 0
Unit division: 0

## 2018-07-21 LAB — BLOOD PRODUCT ORDER (VERBAL) VERIFICATION

## 2018-07-21 LAB — BASIC METABOLIC PANEL
Anion gap: 6 (ref 5–15)
BUN: 8 mg/dL (ref 6–20)
CO2: 29 mmol/L (ref 22–32)
CREATININE: 0.82 mg/dL (ref 0.61–1.24)
Calcium: 8.6 mg/dL — ABNORMAL LOW (ref 8.9–10.3)
Chloride: 103 mmol/L (ref 98–111)
GFR calc Af Amer: >60 mL/min (ref >60)
GFR calc non Af Amer: >60 mL/min (ref >60)
Glucose, Bld: 88 mg/dL (ref 70–99)
Potassium: 4 mmol/L (ref 3.5–5.1)
Sodium: 138 mmol/L (ref 135–145)

## 2018-07-21 LAB — CBC
HEMATOCRIT: 40.4 % (ref 39.0–52.0)
Hemoglobin: 12.5 g/dL — ABNORMAL LOW (ref 13.0–17.0)
MCH: 30.1 pg (ref 26.0–34.0)
MCHC: 30.9 g/dL (ref 30.0–36.0)
MCV: 97.3 fL (ref 80.0–100.0)
Platelets: 202 10*3/uL (ref 150–400)
RBC: 4.15 MIL/uL — ABNORMAL LOW (ref 4.22–5.81)
RDW: 13.4 % (ref 11.5–15.5)
WBC: 9.7 10*3/uL (ref 4.0–10.5)
nRBC: 0 % (ref 0.0–0.2)

## 2018-07-21 MED ORDER — LORAZEPAM 1 MG PO TABS
0.0000 mg | ORAL_TABLET | Freq: Two times a day (BID) | ORAL | Status: DC
  Administered 2018-07-23 – 2018-07-24 (×2): 1 mg via ORAL
  Filled 2018-07-21 (×2): qty 1

## 2018-07-21 MED ORDER — ACETAMINOPHEN 500 MG PO TABS
1000.0000 mg | ORAL_TABLET | Freq: Three times a day (TID) | ORAL | Status: DC
  Administered 2018-07-21 – 2018-07-24 (×9): 1000 mg via ORAL
  Filled 2018-07-21 (×9): qty 2

## 2018-07-21 MED ORDER — LORAZEPAM 1 MG PO TABS
0.0000 mg | ORAL_TABLET | Freq: Four times a day (QID) | ORAL | Status: AC
  Administered 2018-07-21 – 2018-07-23 (×5): 1 mg via ORAL
  Filled 2018-07-21 (×6): qty 1

## 2018-07-21 MED ORDER — LORAZEPAM 2 MG/ML IJ SOLN: 1.0000 mg | Freq: Four times a day (QID) | INTRAMUSCULAR | Status: AC | PRN

## 2018-07-21 MED ORDER — LORAZEPAM 1 MG PO TABS
1.0000 mg | ORAL_TABLET | Freq: Four times a day (QID) | ORAL | Status: AC | PRN
  Administered 2018-07-22: 1 mg via ORAL

## 2018-07-21 MED ORDER — LEVETIRACETAM 500 MG PO TABS
500.0000 mg | ORAL_TABLET | Freq: Two times a day (BID) | ORAL | Status: DC
  Administered 2018-07-21 – 2018-07-24 (×7): 500 mg via ORAL
  Filled 2018-07-21 (×7): qty 1

## 2018-07-21 MED ORDER — OXYCODONE HCL 5 MG PO TABS
5.0000 mg | ORAL_TABLET | ORAL | Status: DC | PRN
  Administered 2018-07-21 (×3): 10 mg via ORAL
  Administered 2018-07-22: 5 mg via ORAL
  Administered 2018-07-22 – 2018-07-23 (×6): 10 mg via ORAL
  Administered 2018-07-23: 5 mg via ORAL
  Administered 2018-07-23 – 2018-07-24 (×2): 10 mg via ORAL
  Filled 2018-07-21 (×9): qty 2
  Filled 2018-07-21: qty 1
  Filled 2018-07-21 (×3): qty 2

## 2018-07-21 MED ORDER — SPIRITUS FRUMENTI
1.0000 | Freq: Three times a day (TID) | ORAL | Status: DC
  Administered 2018-07-21 – 2018-07-24 (×10): 1 via ORAL
  Filled 2018-07-21 (×14): qty 1

## 2018-07-21 MED ORDER — TRAMADOL HCL 50 MG PO TABS
50.0000 mg | ORAL_TABLET | Freq: Four times a day (QID) | ORAL | Status: DC
  Administered 2018-07-21 – 2018-07-23 (×7): 50 mg via ORAL
  Filled 2018-07-21 (×8): qty 1
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
## 2018-07-21 NOTE — Progress Notes (Signed)
Received patient from 4N. Patient alert and oriented. Patient ambulated from wheelchair to bed. Dressings c/d/i. Patient oriented to room/call bell. Will continue to monitor.

## 2018-07-21 NOTE — Plan of Care (Signed)
Pt A&O x4 this AM. Tolerating clear liquid diet, will transition to full liquid. Additional pain medications added d/t pt abdominal discomfort. Incisions c/d/i. Femoral central line removed. Pt ambulated to wheelchair and transferred to 6N26.

## 2018-07-21 NOTE — Anesthesia Postprocedure Evaluation (Signed)
Anesthesia Post Note  Patient: Scott Riley  Procedure(s) Performed: EXPLORATORY LAPAROTOMY (N/A Abdomen)     Patient location during evaluation: ICU Anesthesia Type: General Level of consciousness: sedated and patient remains intubated per anesthesia plan Pain management: pain level controlled Vital Signs Assessment: post-procedure vital signs reviewed and stable Respiratory status: patient remains intubated per anesthesia plan Cardiovascular status: stable Postop Assessment: no apparent nausea or vomiting Anesthetic complications: no    Last Vitals:  Vitals:   07/21/18 0600 07/21/18 0700  BP: 113/65 120/73  Pulse: 66 (!) 53  Resp: (!) 25 (!) 21  Temp:    SpO2: 100% 100%    Last Pain:  Vitals:   07/21/18 0400  TempSrc: Oral  PainSc:                  Beryle Lathehomas E Misaki Sozio

## 2018-07-22 MED ORDER — TRAZODONE HCL 150 MG PO TABS
150.0000 mg | ORAL_TABLET | Freq: Every day | ORAL | Status: DC
  Administered 2018-07-22 – 2018-07-23 (×2): 150 mg via ORAL
  Filled 2018-07-22 (×2): qty 1

## 2018-07-22 MED ORDER — GABAPENTIN 100 MG PO CAPS
100.0000 mg | ORAL_CAPSULE | Freq: Three times a day (TID) | ORAL | Status: DC
  Administered 2018-07-22 – 2018-07-24 (×7): 100 mg via ORAL
  Filled 2018-07-22 (×7): qty 1

## 2018-07-22 MED ORDER — POLYETHYLENE GLYCOL 3350 17 G PO PACK
17.0000 g | PACK | Freq: Every day | ORAL | Status: DC
  Administered 2018-07-22 – 2018-07-24 (×3): 17 g via ORAL
  Filled 2018-07-22 (×3): qty 1

## 2018-07-22 MED ORDER — CITALOPRAM HYDROBROMIDE 20 MG PO TABS
20.0000 mg | ORAL_TABLET | Freq: Every day | ORAL | Status: DC
  Administered 2018-07-22 – 2018-07-24 (×3): 20 mg via ORAL
  Filled 2018-07-22 (×3): qty 1
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
## 2018-07-23 ENCOUNTER — Inpatient Hospital Stay (HOSPITAL_COMMUNITY): Payer: Self-pay

## 2018-07-23 MED ORDER — ADULT MULTIVITAMIN W/MINERALS CH: 1.0000 | ORAL_TABLET | Freq: Every day | ORAL | 0 refills | Status: AC

## 2018-07-23 MED ORDER — CITALOPRAM HYDROBROMIDE 20 MG PO TABS: 20.0000 mg | ORAL_TABLET | Freq: Every day | ORAL | 0 refills | Status: AC

## 2018-07-23 MED ORDER — TRAZODONE HCL 150 MG PO TABS: 150.0000 mg | ORAL_TABLET | Freq: Every day | ORAL | 0 refills | Status: AC

## 2018-07-23 MED ORDER — LEVETIRACETAM 500 MG PO TABS: 500.0000 mg | ORAL_TABLET | Freq: Two times a day (BID) | ORAL | 0 refills | Status: AC

## 2018-07-23 MED ORDER — OXYCODONE HCL 5 MG PO TABS: 5.0000 mg | ORAL_TABLET | Freq: Four times a day (QID) | ORAL | 0 refills | Status: DC | PRN

## 2018-07-23 MED ORDER — DOCUSATE SODIUM 100 MG PO CAPS: 100.0000 mg | ORAL_CAPSULE | Freq: Two times a day (BID) | ORAL | 0 refills | Status: AC

## 2018-07-23 MED ORDER — DM-GUAIFENESIN ER 30-600 MG PO TB12
1.0000 | ORAL_TABLET | Freq: Two times a day (BID) | ORAL | Status: DC
  Administered 2018-07-23 – 2018-07-24 (×3): 1 via ORAL
  Filled 2018-07-23 (×3): qty 1

## 2018-07-23 MED ORDER — GABAPENTIN 100 MG PO CAPS: 100.0000 mg | ORAL_CAPSULE | Freq: Three times a day (TID) | ORAL | 0 refills | Status: AC

## 2018-07-23 MED ORDER — POLYETHYLENE GLYCOL 3350 17 G PO PACK: 17.0000 g | PACK | Freq: Every day | ORAL | 0 refills | Status: AC

## 2018-07-23 MED ORDER — TRAMADOL HCL 50 MG PO TABS: 50.0000 mg | ORAL_TABLET | Freq: Four times a day (QID) | ORAL | Status: DC | PRN

## 2018-07-23 MED ORDER — ACETAMINOPHEN 500 MG PO TABS: 1000.0000 mg | ORAL_TABLET | Freq: Three times a day (TID) | ORAL | 0 refills | Status: AC | PRN

## 2018-07-23 MED ORDER — MAGNESIUM HYDROXIDE 400 MG/5ML PO SUSP
30.0000 mL | Freq: Once | ORAL | Status: AC
  Administered 2018-07-23: 30 mL via ORAL
  Filled 2018-07-23: qty 30
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
## 2018-07-23 NOTE — Clinical Social Work Note (Signed)
Clinical Social Worker continuing to follow patient for support and discharge planning needs.  Patient states that primary concern is getting [place] [facility] [place].  Patient requested CSW to contact Will at [facility] - CSW left message and await return call.  CSW updated PA and will follow up to assist with discharge planning needs.  Scott Riley, [place] [phone]

## 2018-07-23 NOTE — Plan of Care (Signed)
  Problem: Education: Goal: Knowledge of General Education information will improve Description: Including pain rating scale, medication(s)/side effects and non-pharmacologic comfort measures Outcome: Progressing   Problem: Activity: Goal: Risk for activity intolerance will decrease Outcome: Progressing   Problem: Nutrition: Goal: Adequate nutrition will be maintained Outcome: Progressing   

## 2018-07-24 MED ORDER — GLYCERIN (LAXATIVE) 2.1 G RE SUPP
1.0000 | Freq: Once | RECTAL | Status: AC
  Administered 2018-07-24: 1 via RECTAL
  Filled 2018-07-24: qty 1

## 2018-07-24 NOTE — Plan of Care (Signed)
  Problem: Pain Managment: Goal: General experience of comfort will improve Outcome: Progressing   Problem: Safety: Goal: Ability to remain free from injury will improve Outcome: Progressing   Problem: Skin Integrity: Goal: Risk for impaired skin integrity will decrease Outcome: Progressing   

## 2018-07-24 NOTE — Progress Notes (Signed)
Reviewed AVS discharge instructions with patient/caregiver. Patient/caregiver verbalizes understanding of instructions received. AVS and prescriptions received by patient/caregiver. If present, telemetry box removed and central cardiac monitoring department notified of discharge. Peripheral IV removed, site benign with tip intact. Patient has medications at bedside, delivered by pharmacy. Patient stated he is waiting for CSW to help him "find a place to go".
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
## 2018-07-26 ENCOUNTER — Emergency Department (HOSPITAL_COMMUNITY): Payer: Self-pay

## 2018-07-26 ENCOUNTER — Other Ambulatory Visit: Payer: Self-pay

## 2018-07-26 ENCOUNTER — Encounter (HOSPITAL_COMMUNITY): Payer: Self-pay | Admitting: Radiology

## 2018-07-26 ENCOUNTER — Inpatient Hospital Stay (HOSPITAL_COMMUNITY)
Admission: EM | Admit: 2018-07-26 | Discharge: 2018-07-30 | DRG: 176 | Disposition: A | Discharge status: Home Health | Payer: Self-pay | Attending: Internal Medicine | Admitting: Internal Medicine

## 2018-07-26 DIAGNOSIS — R0600 Dyspnea, unspecified: Secondary | ICD-10-CM

## 2018-07-26 DIAGNOSIS — G40909 Epilepsy, unspecified, not intractable, without status epilepticus: Secondary | ICD-10-CM | POA: Diagnosis present

## 2018-07-26 DIAGNOSIS — S31119A Laceration without foreign body of abdominal wall, unspecified quadrant without penetration into peritoneal cavity, initial encounter: Secondary | ICD-10-CM | POA: Diagnosis present

## 2018-07-26 DIAGNOSIS — F1910 Other psychoactive substance abuse, uncomplicated: Secondary | ICD-10-CM | POA: Diagnosis present

## 2018-07-26 DIAGNOSIS — K7689 Other specified diseases of liver: Secondary | ICD-10-CM | POA: Diagnosis present

## 2018-07-26 DIAGNOSIS — Z9889 Other specified postprocedural states: Secondary | ICD-10-CM

## 2018-07-26 DIAGNOSIS — F10239 Alcohol dependence with withdrawal, unspecified: Secondary | ICD-10-CM | POA: Diagnosis present

## 2018-07-26 DIAGNOSIS — F1010 Alcohol abuse, uncomplicated: Secondary | ICD-10-CM | POA: Diagnosis present

## 2018-07-26 DIAGNOSIS — F329 Major depressive disorder, single episode, unspecified: Secondary | ICD-10-CM | POA: Diagnosis present

## 2018-07-26 DIAGNOSIS — F419 Anxiety disorder, unspecified: Secondary | ICD-10-CM | POA: Diagnosis present

## 2018-07-26 DIAGNOSIS — K5909 Other constipation: Secondary | ICD-10-CM | POA: Diagnosis present

## 2018-07-26 DIAGNOSIS — Z79899 Other long term (current) drug therapy: Secondary | ICD-10-CM

## 2018-07-26 DIAGNOSIS — J90 Pleural effusion, not elsewhere classified: Secondary | ICD-10-CM | POA: Diagnosis present

## 2018-07-26 DIAGNOSIS — Z590 Homelessness: Secondary | ICD-10-CM

## 2018-07-26 DIAGNOSIS — Z882 Allergy status to sulfonamides status: Secondary | ICD-10-CM

## 2018-07-26 DIAGNOSIS — I2694 Multiple subsegmental pulmonary emboli without acute cor pulmonale: Principal | ICD-10-CM | POA: Diagnosis present

## 2018-07-26 DIAGNOSIS — I2699 Other pulmonary embolism without acute cor pulmonale: Secondary | ICD-10-CM | POA: Diagnosis present

## 2018-07-26 DIAGNOSIS — Z885 Allergy status to narcotic agent status: Secondary | ICD-10-CM

## 2018-07-26 DIAGNOSIS — Z7901 Long term (current) use of anticoagulants: Secondary | ICD-10-CM

## 2018-07-26 HISTORY — DX: Laceration without foreign body of abdominal wall, unspecified quadrant without penetration into peritoneal cavity, initial encounter: S31.119A

## 2018-07-26 HISTORY — DX: Other pulmonary embolism without acute cor pulmonale: I26.99

## 2018-07-26 LAB — BASIC METABOLIC PANEL
Anion gap: 13 (ref 5–15)
BUN: 8 mg/dL (ref 6–20)
CO2: 23 mmol/L (ref 22–32)
Calcium: 9.4 mg/dL (ref 8.9–10.3)
Chloride: 99 mmol/L (ref 98–111)
Creatinine, Ser: 0.76 mg/dL (ref 0.61–1.24)
GFR calc Af Amer: >60 mL/min (ref >60)
GFR calc non Af Amer: >60 mL/min (ref >60)
Glucose, Bld: 110 mg/dL — ABNORMAL HIGH (ref 70–99)
Potassium: 4.5 mmol/L (ref 3.5–5.1)
Sodium: 135 mmol/L (ref 135–145)

## 2018-07-26 LAB — CBC WITH DIFFERENTIAL/PLATELET
Abs Immature Granulocytes: 0.06 10*3/uL (ref 0.00–0.07)
Basophils Absolute: 0 10*3/uL (ref 0.0–0.1)
Basophils Relative: 0 %
Eosinophils Absolute: 0.3 10*3/uL (ref 0.0–0.5)
Eosinophils Relative: 2 %
HCT: 41 % (ref 39.0–52.0)
Hemoglobin: 13.2 g/dL (ref 13.0–17.0)
Immature Granulocytes: 0 %
Lymphocytes Relative: 6 %
Lymphs Abs: 0.9 10*3/uL (ref 0.7–4.0)
MCH: 30.7 pg (ref 26.0–34.0)
MCHC: 32.2 g/dL (ref 30.0–36.0)
MCV: 95.3 fL (ref 80.0–100.0)
Monocytes Absolute: 0.9 10*3/uL (ref 0.1–1.0)
Monocytes Relative: 6 %
Neutro Abs: 11.6 10*3/uL — ABNORMAL HIGH (ref 1.7–7.7)
Neutrophils Relative %: 86 %
Platelets: 301 10*3/uL (ref 150–400)
RBC: 4.3 MIL/uL (ref 4.22–5.81)
RDW: 12.9 % (ref 11.5–15.5)
WBC: 13.7 10*3/uL — ABNORMAL HIGH (ref 4.0–10.5)
nRBC: 0 % (ref 0.0–0.2)

## 2018-07-26 LAB — HEPARIN LEVEL (UNFRACTIONATED): Heparin Unfractionated: 0.11 IU/mL — ABNORMAL LOW (ref 0.30–0.70)

## 2018-07-26 LAB — I-STAT CG4 LACTIC ACID, ED: Lactic Acid, Venous: 0.94 mmol/L (ref 0.5–1.9)

## 2018-07-26 MED ORDER — FENTANYL CITRATE (PF) 100 MCG/2ML IJ SOLN
100.0000 ug | Freq: Once | INTRAMUSCULAR | Status: AC
  Administered 2018-07-26: 100 ug via INTRAVENOUS
  Filled 2018-07-26: qty 2

## 2018-07-26 MED ORDER — GABAPENTIN 100 MG PO CAPS
100.0000 mg | ORAL_CAPSULE | Freq: Three times a day (TID) | ORAL | Status: DC
  Administered 2018-07-26 – 2018-07-30 (×12): 100 mg via ORAL
  Filled 2018-07-26 (×12): qty 1

## 2018-07-26 MED ORDER — ACETAMINOPHEN 325 MG PO TABS
650.0000 mg | ORAL_TABLET | Freq: Four times a day (QID) | ORAL | Status: DC | PRN
  Administered 2018-07-26 – 2018-07-29 (×8): 650 mg via ORAL
  Filled 2018-07-26 (×9): qty 2

## 2018-07-26 MED ORDER — LEVETIRACETAM 500 MG PO TABS
500.0000 mg | ORAL_TABLET | Freq: Two times a day (BID) | ORAL | Status: DC
  Administered 2018-07-26 – 2018-07-30 (×8): 500 mg via ORAL
  Filled 2018-07-26 (×8): qty 1

## 2018-07-26 MED ORDER — PIPERACILLIN-TAZOBACTAM 3.375 G IVPB 30 MIN
3.3750 g | Freq: Once | INTRAVENOUS | Status: AC
  Administered 2018-07-26: 3.375 g via INTRAVENOUS
  Filled 2018-07-26: qty 50

## 2018-07-26 MED ORDER — OXYCODONE HCL 5 MG PO TABS
5.0000 mg | ORAL_TABLET | Freq: Four times a day (QID) | ORAL | Status: DC | PRN
  Administered 2018-07-26 – 2018-07-30 (×15): 10 mg via ORAL
  Filled 2018-07-26 (×15): qty 2

## 2018-07-26 MED ORDER — ONDANSETRON HCL 4 MG PO TABS: 4.0000 mg | ORAL_TABLET | Freq: Four times a day (QID) | ORAL | Status: DC | PRN

## 2018-07-26 MED ORDER — POLYETHYLENE GLYCOL 3350 17 G PO PACK
17.0000 g | PACK | Freq: Two times a day (BID) | ORAL | Status: DC
  Administered 2018-07-26 – 2018-07-30 (×8): 17 g via ORAL
  Filled 2018-07-26 (×8): qty 1

## 2018-07-26 MED ORDER — VANCOMYCIN HCL IN DEXTROSE 1-5 GM/200ML-% IV SOLN
1000.0000 mg | Freq: Once | INTRAVENOUS | Status: AC
  Administered 2018-07-26: 1000 mg via INTRAVENOUS
  Filled 2018-07-26: qty 200

## 2018-07-26 MED ORDER — HEPARIN BOLUS VIA INFUSION
5000.0000 [IU] | Freq: Once | INTRAVENOUS | Status: AC
  Administered 2018-07-26: 5000 [IU] via INTRAVENOUS
  Filled 2018-07-26: qty 5000

## 2018-07-26 MED ORDER — CITALOPRAM HYDROBROMIDE 20 MG PO TABS
20.0000 mg | ORAL_TABLET | Freq: Every day | ORAL | Status: DC
  Administered 2018-07-26 – 2018-07-30 (×5): 20 mg via ORAL
  Filled 2018-07-26 (×4): qty 1
  Filled 2018-07-26: qty 2

## 2018-07-26 MED ORDER — IOPAMIDOL (ISOVUE-370) INJECTION 76%
INTRAVENOUS | Status: AC
  Filled 2018-07-26: qty 100

## 2018-07-26 MED ORDER — ACETAMINOPHEN 650 MG RE SUPP: 650.0000 mg | Freq: Four times a day (QID) | RECTAL | Status: DC | PRN

## 2018-07-26 MED ORDER — IOPAMIDOL (ISOVUE-370) INJECTION 76%
100.0000 mL | Freq: Once | INTRAVENOUS | Status: AC | PRN
  Administered 2018-07-26: 100 mL via INTRAVENOUS

## 2018-07-26 MED ORDER — HEPARIN BOLUS VIA INFUSION
3000.0000 [IU] | Freq: Once | INTRAVENOUS | Status: AC
  Administered 2018-07-26: 3000 [IU] via INTRAVENOUS
  Filled 2018-07-26: qty 3000

## 2018-07-26 MED ORDER — ONDANSETRON HCL 4 MG/2ML IJ SOLN
4.0000 mg | Freq: Four times a day (QID) | INTRAMUSCULAR | Status: DC | PRN
  Administered 2018-07-26: 4 mg via INTRAVENOUS
  Filled 2018-07-26: qty 2

## 2018-07-26 MED ORDER — TRAZODONE HCL 150 MG PO TABS
150.0000 mg | ORAL_TABLET | Freq: Every day | ORAL | Status: DC
  Administered 2018-07-26 – 2018-07-29 (×4): 150 mg via ORAL
  Filled 2018-07-26 (×4): qty 1

## 2018-07-26 MED ORDER — HEPARIN (PORCINE) 25000 UT/250ML-% IV SOLN
2250.0000 [IU]/h | INTRAVENOUS | Status: AC
  Administered 2018-07-26: 1450 [IU]/h via INTRAVENOUS
  Administered 2018-07-28 (×2): 2250 [IU]/h via INTRAVENOUS
  Filled 2018-07-26 (×5): qty 250
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
## 2018-07-26 NOTE — ED Notes (Signed)
On return from CT pt c/o severe worsening chest pain requesting something for pain. MD Hyacinth MeekerMiller made immediately aware of pts complaints. VSS at this time

## 2018-07-26 NOTE — ED Notes (Signed)
MD Hyacinth MeekerMiller to bedside to discuss pt complaints. MD informed pt that he will be awaiting read of CT results prior to ordering analgesia. Pt voiced understanding

## 2018-07-26 NOTE — Progress Notes (Addendum)
ANTICOAGULATION CONSULT NOTE - Initial Consult  Pharmacy Consult for Heparin Indication: pulmonary embolus  Allergies  Allergen Reactions  . Sulfa Antibiotics Hives and Rash    Patient Measurements: Height: 6\' 4"  (193 cm) Weight: 200 lb (90.7 kg) IBW/kg (Calculated) : 86.8 Heparin Dosing Weight: 91  Vital Signs: Temp: 98.2 F (36.8 C) (12/14 0834) Temp Source: Oral (12/14 0834) BP: 130/88 (12/14 1335) Pulse Rate: 88 (12/14 1335)  Labs: Recent Labs    07/26/18 1036  HGB 13.2  HCT 41.0  PLT 301  CREATININE 0.76    Estimated Creatinine Clearance: 137.1 mL/min (by C-G formula based on SCr of 0.76 mg/dL).  Assessment: 47 year old male to begin heparin for PE Scr = 0.76, baseline CBC stable  Goal of Therapy:  Heparin level 0.3-0.7 units/ml Monitor platelets by anticoagulation protocol: Yes   Plan:  Heparin 5000 units iv bolus x 1 Heparin drip at 1450 units / hr Heparin level at 2100 pm Daily heparin level, CBC  Thank you Okey RegalLisa Janet Decesare, PharmD [phone]  07/26/2018,2:04 PM   21:30 Pm Initial heparin level low at 0.11  Plan:  Heparin 3000 units iv bolus x 1, increase drip to 1650 units / hr Heparin level 6 in 6 hours  Thank you Okey RegalLisa Janequa Kipnis, PharmD [phone]

## 2018-07-26 NOTE — ED Notes (Signed)
Pt calling out again c/o of continued pain, complaints relayed to MD Hyacinth MeekerMiller. CT results are pending.

## 2018-07-26 NOTE — ED Notes (Signed)
Pt calling out again re: pain. MD Hyacinth MeekerMiller aware.
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
## 2018-07-26 NOTE — ED Notes (Signed)
Admitting MD at bedside.
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
## 2018-07-27 DIAGNOSIS — F1010 Alcohol abuse, uncomplicated: Secondary | ICD-10-CM

## 2018-07-27 LAB — CBC
HCT: 37.1 % — ABNORMAL LOW (ref 39.0–52.0)
Hemoglobin: 12.1 g/dL — ABNORMAL LOW (ref 13.0–17.0)
MCH: 30.9 pg (ref 26.0–34.0)
MCHC: 32.6 g/dL (ref 30.0–36.0)
MCV: 94.6 fL (ref 80.0–100.0)
NRBC: 0 % (ref 0.0–0.2)
Platelets: 330 10*3/uL (ref 150–400)
RBC: 3.92 MIL/uL — ABNORMAL LOW (ref 4.22–5.81)
RDW: 12.9 % (ref 11.5–15.5)
WBC: 10.1 10*3/uL (ref 4.0–10.5)

## 2018-07-27 LAB — BASIC METABOLIC PANEL
Anion gap: 13 (ref 5–15)
BUN: 9 mg/dL (ref 6–20)
CO2: 26 mmol/L (ref 22–32)
Calcium: 9.2 mg/dL (ref 8.9–10.3)
Chloride: 96 mmol/L — ABNORMAL LOW (ref 98–111)
Creatinine, Ser: 0.79 mg/dL (ref 0.61–1.24)
GFR calc Af Amer: >60 mL/min (ref >60)
GFR calc non Af Amer: >60 mL/min (ref >60)
Glucose, Bld: 99 mg/dL (ref 70–99)
Potassium: 3.5 mmol/L (ref 3.5–5.1)
SODIUM: 135 mmol/L (ref 135–145)

## 2018-07-27 LAB — HEPARIN LEVEL (UNFRACTIONATED)
Heparin Unfractionated: <0.1 IU/mL — ABNORMAL LOW (ref 0.30–0.70)
Heparin Unfractionated: 0.25 IU/mL — ABNORMAL LOW (ref 0.30–0.70)
Heparin Unfractionated: 0.33 IU/mL (ref 0.30–0.70)

## 2018-07-27 MED ORDER — HEPARIN BOLUS VIA INFUSION
3000.0000 [IU] | Freq: Once | INTRAVENOUS | Status: AC
  Administered 2018-07-27: 3000 [IU] via INTRAVENOUS
  Filled 2018-07-27: qty 3000

## 2018-07-27 MED ORDER — HEPARIN BOLUS VIA INFUSION
1500.0000 [IU] | Freq: Once | INTRAVENOUS | Status: AC
  Administered 2018-07-27: 1500 [IU] via INTRAVENOUS
  Filled 2018-07-27: qty 1500

## 2018-07-27 NOTE — Progress Notes (Signed)
ANTICOAGULATION CONSULT NOTE   Pharmacy Consult for Heparin Indication: pulmonary embolus  Allergies  Allergen Reactions  . Sulfa Antibiotics Hives and Rash    Patient Measurements: Height: 6\' 4"  (193 cm) Weight: 200 lb (90.7 kg) IBW/kg (Calculated) : 86.8 Heparin Dosing Weight: 91  Vital Signs: Temp: 98.4 F (36.9 C) (12/15 0450) Temp Source: Oral (12/15 0450) BP: 138/83 (12/15 0450) Pulse Rate: 88 (12/15 0450)  Labs: Recent Labs    07/26/18 1036 07/26/18 2056 07/27/18 0436 07/27/18 1310  HGB 13.2  --  12.1*  --   HCT 41.0  --  37.1*  --   PLT 301  --  330  --   HEPARINUNFRC  --  0.11* <0.10* 0.25*  CREATININE 0.76  --  0.79  --     Estimated Creatinine Clearance: 137.1 mL/min (by C-G formula based on SCr of 0.79 mg/dL).  Assessment: 89 yoM with PE started on IV heparin. Heparin level slightly subtherapeutic at 0.25, CBC stable this morning.  Goal of Therapy:  Heparin level 0.3-0.7 units/ml Monitor platelets by anticoagulation protocol: Yes   Plan:  -Heparin 1500 units x1 then increase infusion to 2150 units/hr -Recheck heparin level this evening  Fredonia HighlandMichael Kayven Aldaco, PharmD, BCPS Clinical Pharmacist [phone] Please check AMION for all [facility] numbers 07/27/2018

## 2018-07-27 NOTE — Progress Notes (Signed)
ANTICOAGULATION CONSULT NOTE   Pharmacy Consult for Heparin Indication: pulmonary embolus  Allergies  Allergen Reactions  . Sulfa Antibiotics Hives and Rash    Patient Measurements: Height: 6\' 4"  (193 cm) Weight: 200 lb (90.7 kg) IBW/kg (Calculated) : 86.8 Heparin Dosing Weight: 91  Vital Signs: Temp: 98.4 F (36.9 C) (12/15 0450) Temp Source: Oral (12/15 0450) BP: 138/83 (12/15 0450) Pulse Rate: 88 (12/15 0450)  Labs: Recent Labs    07/26/18 1036 07/26/18 2056 07/27/18 0436  HGB 13.2  --  12.1*  HCT 41.0  --  37.1*  PLT 301  --  330  HEPARINUNFRC  --  0.11* <0.10*  CREATININE 0.76  --  0.79    Estimated Creatinine Clearance: 137.1 mL/min (by C-G formula based on SCr of 0.79 mg/dL).  Assessment: [age] year old male to begin heparin for PE Scr = 0.76, baseline CBC stable Heparin level this am <0.1 units/ml No issues noted with infusion  Goal of Therapy:  Heparin level 0.3-0.7 units/ml Monitor platelets by anticoagulation protocol: Yes   Plan:  Heparin 3000 units iv bolus x 1 Heparin drip at 1950 units / hr Heparin level in 6 hours Daily heparin level, CBC  Thanks for allowing pharmacy to be a part of this patient's care.  Talbert CageLora Moyses Pavey, PharmD Clinical Pharmacist

## 2018-07-27 NOTE — Progress Notes (Signed)
ANTICOAGULATION CONSULT NOTE   Pharmacy Consult for Heparin Indication: pulmonary embolus  Allergies  Allergen Reactions  . Sulfa Antibiotics Hives and Rash    Patient Measurements: Height: 6\' 4"  (193 cm) Weight: 200 lb (90.7 kg) IBW/kg (Calculated) : 86.8 Heparin Dosing Weight: 91  Vital Signs: Temp: 97.4 F (36.3 C) (12/15 1940) Temp Source: Oral (12/15 1940) BP: 140/79 (12/15 1940) Pulse Rate: 79 (12/15 1940)  Labs: Recent Labs    07/26/18 1036  07/27/18 0436 07/27/18 1310 07/27/18 2019  HGB 13.2  --  12.1*  --   --   HCT 41.0  --  37.1*  --   --   PLT 301  --  330  --   --   HEPARINUNFRC  --    < > <0.10* 0.25* 0.33  CREATININE 0.76  --  0.79  --   --    < > = values in this interval not displayed.    Estimated Creatinine Clearance: 137.1 mL/min (by C-G formula based on SCr of 0.79 mg/dL).  Assessment: [age] yoM with PE started on IV heparin. Heparin level slightly subtherapeutic at 0.25 this morning, now within goal range at 0.33 after dose increase. Will increase infusion rate slightly as level is borderline and patient received bolus this afternoon.  Goal of Therapy:  Heparin level 0.3-0.7 units/ml Monitor platelets by anticoagulation protocol: Yes   Plan:  -Increase heparin to 2250 units/hr -Recheck confirmatory level with daily lab draw  Fredonia HighlandMichael , PharmD, BCPS Clinical Pharmacist Please check AMION for all [facility] numbers 07/27/2018
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
## 2018-07-28 ENCOUNTER — Inpatient Hospital Stay (HOSPITAL_COMMUNITY): Payer: Self-pay

## 2018-07-28 LAB — BASIC METABOLIC PANEL
Anion gap: 15 (ref 5–15)
BUN: 7 mg/dL (ref 6–20)
CO2: 23 mmol/L (ref 22–32)
Calcium: 9.4 mg/dL (ref 8.9–10.3)
Chloride: 97 mmol/L — ABNORMAL LOW (ref 98–111)
Creatinine, Ser: 0.73 mg/dL (ref 0.61–1.24)
GFR calc Af Amer: >60 mL/min (ref >60)
GFR calc non Af Amer: >60 mL/min (ref >60)
GLUCOSE: 89 mg/dL (ref 70–99)
Potassium: 4.3 mmol/L (ref 3.5–5.1)
Sodium: 135 mmol/L (ref 135–145)

## 2018-07-28 LAB — CBC
HEMATOCRIT: 39.3 % (ref 39.0–52.0)
Hemoglobin: 12.8 g/dL — ABNORMAL LOW (ref 13.0–17.0)
MCH: 30.6 pg (ref 26.0–34.0)
MCHC: 32.6 g/dL (ref 30.0–36.0)
MCV: 94 fL (ref 80.0–100.0)
Platelets: 406 10*3/uL — ABNORMAL HIGH (ref 150–400)
RBC: 4.18 MIL/uL — ABNORMAL LOW (ref 4.22–5.81)
RDW: 12.6 % (ref 11.5–15.5)
WBC: 10.8 10*3/uL — AB (ref 4.0–10.5)
nRBC: 0 % (ref 0.0–0.2)

## 2018-07-28 LAB — HEPARIN LEVEL (UNFRACTIONATED): Heparin Unfractionated: 0.35 IU/mL (ref 0.30–0.70)

## 2018-07-28 LAB — PROCALCITONIN: Procalcitonin: <0.1 ng/mL

## 2018-07-28 MED ORDER — APIXABAN 5 MG PO TABS: 5.0000 mg | ORAL_TABLET | Freq: Two times a day (BID) | ORAL | Status: DC

## 2018-07-28 MED ORDER — VITAMIN B-1 100 MG PO TABS
100.0000 mg | ORAL_TABLET | Freq: Every day | ORAL | Status: DC
  Administered 2018-07-28 – 2018-07-30 (×3): 100 mg via ORAL
  Filled 2018-07-28 (×3): qty 1

## 2018-07-28 MED ORDER — THIAMINE HCL 100 MG/ML IJ SOLN
100.0000 mg | Freq: Every day | INTRAMUSCULAR | Status: DC
  Filled 2018-07-28: qty 2

## 2018-07-28 MED ORDER — CHLORHEXIDINE GLUCONATE CLOTH 2 % EX PADS
6.0000 | MEDICATED_PAD | Freq: Every day | CUTANEOUS | Status: DC
  Administered 2018-07-28 – 2018-07-30 (×3): 6 via TOPICAL

## 2018-07-28 MED ORDER — MUPIROCIN 2 % EX OINT
1.0000 "application " | TOPICAL_OINTMENT | Freq: Two times a day (BID) | CUTANEOUS | Status: DC
  Administered 2018-07-28 – 2018-07-30 (×5): 1 via NASAL
  Filled 2018-07-28: qty 22

## 2018-07-28 MED ORDER — ADULT MULTIVITAMIN W/MINERALS CH
1.0000 | ORAL_TABLET | Freq: Every day | ORAL | Status: DC
  Administered 2018-07-28 – 2018-07-30 (×3): 1 via ORAL
  Filled 2018-07-28 (×3): qty 1

## 2018-07-28 MED ORDER — FOLIC ACID 1 MG PO TABS
1.0000 mg | ORAL_TABLET | Freq: Every day | ORAL | Status: DC
  Administered 2018-07-28 – 2018-07-30 (×3): 1 mg via ORAL
  Filled 2018-07-28 (×3): qty 1

## 2018-07-28 MED ORDER — APIXABAN 5 MG PO TABS
10.0000 mg | ORAL_TABLET | Freq: Two times a day (BID) | ORAL | Status: DC
  Administered 2018-07-28 – 2018-07-30 (×4): 10 mg via ORAL
  Filled 2018-07-28 (×4): qty 2

## 2018-07-28 MED ORDER — LORAZEPAM 1 MG PO TABS: 1.0000 mg | ORAL_TABLET | Freq: Four times a day (QID) | ORAL | Status: DC | PRN

## 2018-07-28 MED ORDER — DEXMEDETOMIDINE HCL IN NACL 200 MCG/50ML IV SOLN
0.2000 ug/kg/h | INTRAVENOUS | Status: DC
  Filled 2018-07-28: qty 50

## 2018-07-28 MED ORDER — LORAZEPAM 2 MG/ML IJ SOLN: 1.0000 mg | Freq: Four times a day (QID) | INTRAMUSCULAR | Status: DC | PRN
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
## 2018-07-28 NOTE — Clinical Social Work Note (Signed)
Clinical Social Work Assessment  Patient Details  Name: Scott Riley MRN: [id number] Date of Birth: 03/27/1971  Date of referral:  07/28/18               Reason for consult:  Housing Concerns/Homelessness                Permission sought to share information with:  Case Manager Permission granted to share information::     Name::        Agency::     Relationship::     Contact Information:     Housing/Transportation Living arrangements for the past 2 months:  Homeless Source of Information:  Patient Patient Interpreter Needed:  None Criminal Activity/Legal Involvement Pertinent to Current Situation/Hospitalization:  No - Comment as needed Significant Relationships:  Parents Lives with:  Other (Comment)(Has been living in and out of homeless shelters. ) Do you feel safe going back to the place where you live?  Yes Need for family participation in patient care:  No (Coment)  Care giving concerns:  Patient has been homeless. CSW was consulted by RNCM. CSW met with the patient. Patient identified that he has been living in and out of homeless shelters. Patient was staying at [facility] in [place]. Patient needed some resources. Patient identified that his mother would like to speak to the CSW.   CSW spoke with patients mother in the hallway. She had concerns about her son. She said that he has a history of using drugs and alcohol. He meets women that bring him down. She reported that he does lose his employment when he gets himself involved with women and drugs.    Social Worker assessment / plan:  CSW spoke with the patient about his homelessness issue. He did identify his mother as a safety resource but stated that he couldn't stay with her at this time. CSW gave the patient a list of local shelters. CSW asked if he would need a bus pass or cab voucher once it was closer time to be discharged. The patient did not know at that moment.   CSW spoke with patient's mother. CSW  spoke with her about possibly helping her son apply for disability. CSW asked if the patient had anyone he could stay with. She identified that he could stay with her to help him get back on his feet.   Employment status:  Other (Comment)(Trying to obtained disability. Has a history of working. ) Insurance information:  Medicaid In State PT Recommendations:    Information / Referral to community resources:  Shelter  Patient/Family's Response to care:  Patient's family is understanding about the patient's situation.   Patient/Family's Understanding of and Emotional Response to Diagnosis, Current Treatment, and Prognosis:  Family is understanding of the current conditions.   Emotional Assessment Appearance:  Appears stated age Attitude/Demeanor/Rapport:  Unable to Assess Affect (typically observed):  Unable to Assess Orientation:  Oriented to Self, Oriented to  Time, Oriented to Place, Oriented to Situation Alcohol / Substance use:  Not Applicable Psych involvement (Current and /or in the community):  No (Comment)  Discharge Needs  Concerns to be addressed:  Homelessness Readmission within the last 30 days:  No Current discharge risk:  Homeless Barriers to Discharge:  No Barriers Identified   Caitlin B Pinion, LCSWA 07/28/2018, 2:40 PM  

## 2018-07-28 NOTE — Progress Notes (Signed)
Referral received for oral anticoag. Needs- spoke with pt at bedside. [facility] Pt sees Scott VernonMaryAnne PA there at the clinic.  TC call made to [facility] at [facility] to discuss preference for oral anti coag. - per conversation with [facility] she is fine with either Xarelto or Eliquis- but would prefer Eliquis. She states that if pt will get 30 free here on discharge then she will f/u with getting pt set up with patient assistance program for Eliquis. CM will provide pt with application for pt assistance for Eliquis to [facility]. MD has been notified regarding above.
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
## 2018-07-28 NOTE — Progress Notes (Signed)
ANTICOAGULATION CONSULT NOTE   Pharmacy Consult for Heparin Indication: pulmonary embolus  Allergies  Allergen Reactions  . Sulfa Antibiotics Hives and Rash    Patient Measurements: Height: 6\' 4"  (193 cm) Weight: 211 lb 4.8 oz (95.8 kg)(Scale A) IBW/kg (Calculated) : 86.8 Heparin Dosing Weight: 95.8 kg (TBW)  Vital Signs: Temp: 98.8 F (37.1 C) (12/16 1533) Temp Source: Oral (12/16 1533) BP: 134/87 (12/16 1533) Pulse Rate: 68 (12/16 1533)  Labs: Recent Labs    07/26/18 1036  07/27/18 0436 07/27/18 1310 07/27/18 2019 07/28/18 0215  HGB 13.2  --  12.1*  --   --  12.8*  HCT 41.0  --  37.1*  --   --  39.3  PLT 301  --  330  --   --  406*  HEPARINUNFRC  --    < > <0.10* 0.25* 0.33 0.35  CREATININE 0.76  --  0.79  --   --  0.73   < > = values in this interval not displayed.    Estimated Creatinine Clearance: 137.1 mL/min (by C-G formula based on SCr of 0.73 mg/dL).  Assessment: 89 yoM admitted 07/26/18 with acute bilateral PE started on IV heparin. Heparin level is 0.35, remains therapeutic on heparin 2250 units/hr.  Hgb slightly low/stable, pltc wnl.  No bleeding noted.  - Not on oral anticoagulation PTA.     PM update, will switch patient to apixaban.   Goal of Therapy:  Heparin level 0.3-0.7 units/ml Monitor platelets by anticoagulation protocol: Yes   Plan:  Stop heparin, give apixaban 10mg  bid x 7days then 5mg  bid  Sheppard CoilFrank Wilson PharmD., BCPS Clinical Pharmacist 07/28/2018 5:23 PM
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
## 2018-07-29 ENCOUNTER — Inpatient Hospital Stay (HOSPITAL_COMMUNITY): Payer: Self-pay

## 2018-07-29 DIAGNOSIS — I361 Nonrheumatic tricuspid (valve) insufficiency: Secondary | ICD-10-CM

## 2018-07-29 LAB — COMPREHENSIVE METABOLIC PANEL
ALT: 36 U/L (ref 0–44)
AST: 22 U/L (ref 15–41)
Albumin: 2.7 g/dL — ABNORMAL LOW (ref 3.5–5.0)
Alkaline Phosphatase: 119 U/L (ref 38–126)
Anion gap: 12 (ref 5–15)
BUN: 8 mg/dL (ref 6–20)
CO2: 24 mmol/L (ref 22–32)
Calcium: 9.6 mg/dL (ref 8.9–10.3)
Chloride: 99 mmol/L (ref 98–111)
Creatinine, Ser: 0.76 mg/dL (ref 0.61–1.24)
GFR calc Af Amer: >60 mL/min (ref >60)
GFR calc non Af Amer: >60 mL/min (ref >60)
Glucose, Bld: 102 mg/dL — ABNORMAL HIGH (ref 70–99)
Potassium: 4 mmol/L (ref 3.5–5.1)
Sodium: 135 mmol/L (ref 135–145)
Total Bilirubin: 0.3 mg/dL (ref 0.3–1.2)
Total Protein: 7.8 g/dL (ref 6.5–8.1)

## 2018-07-29 LAB — CBC
HCT: 38.1 % — ABNORMAL LOW (ref 39.0–52.0)
HEMOGLOBIN: 12.5 g/dL — AB (ref 13.0–17.0)
MCH: 30.6 pg (ref 26.0–34.0)
MCHC: 32.8 g/dL (ref 30.0–36.0)
MCV: 93.2 fL (ref 80.0–100.0)
Platelets: 468 10*3/uL — ABNORMAL HIGH (ref 150–400)
RBC: 4.09 MIL/uL — ABNORMAL LOW (ref 4.22–5.81)
RDW: 12.7 % (ref 11.5–15.5)
WBC: 9.7 10*3/uL (ref 4.0–10.5)
nRBC: 0 % (ref 0.0–0.2)

## 2018-07-29 LAB — ECHOCARDIOGRAM COMPLETE
Height: 76 in
Weight: 3380.8 oz

## 2018-07-29 LAB — PHOSPHORUS: Phosphorus: 4 mg/dL (ref 2.5–4.6)

## 2018-07-29 LAB — TROPONIN I

## 2018-07-29 LAB — MAGNESIUM: Magnesium: 2.2 mg/dL (ref 1.7–2.4)

## 2018-07-29 MED ORDER — CYCLOBENZAPRINE HCL 10 MG PO TABS
5.0000 mg | ORAL_TABLET | Freq: Three times a day (TID) | ORAL | Status: DC
  Administered 2018-07-29 – 2018-07-30 (×4): 5 mg via ORAL
  Filled 2018-07-29 (×4): qty 1

## 2018-07-29 MED ORDER — TRAMADOL HCL 50 MG PO TABS
50.0000 mg | ORAL_TABLET | Freq: Two times a day (BID) | ORAL | Status: DC | PRN
  Administered 2018-07-29 – 2018-07-30 (×3): 50 mg via ORAL
  Filled 2018-07-29 (×3): qty 1

## 2018-07-29 NOTE — Progress Notes (Signed)
Clarified orders with Mattie MarlinJessica Focht PA., to remove patient's staples, in two locations on patient's abdomen. Staples removed.
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
## 2018-07-29 NOTE — Progress Notes (Signed)
  Echocardiogram 2D Echocardiogram has been performed.  Scott Riley 07/29/2018, 12:51 PM

## 2018-07-29 NOTE — Plan of Care (Signed)
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
## 2018-07-30 ENCOUNTER — Other Ambulatory Visit: Payer: Self-pay

## 2018-07-30 ENCOUNTER — Encounter (HOSPITAL_COMMUNITY): Payer: Self-pay | Admitting: General Practice

## 2018-07-30 MED ORDER — APIXABAN (ELIQUIS) EDUCATION KIT FOR DVT/PE PATIENTS: 1.0000 | PACK | Freq: Once | 0 refills | Status: AC

## 2018-07-30 MED ORDER — CYCLOBENZAPRINE HCL 5 MG PO TABS: 5.0000 mg | ORAL_TABLET | Freq: Three times a day (TID) | ORAL | 0 refills | Status: AC | PRN

## 2018-07-30 MED ORDER — THIAMINE HCL 100 MG PO TABS: 100.0000 mg | ORAL_TABLET | Freq: Every day | ORAL | 0 refills | Status: AC

## 2018-07-30 MED ORDER — TRAMADOL HCL 50 MG PO TABS: 50.0000 mg | ORAL_TABLET | Freq: Two times a day (BID) | ORAL | 0 refills | Status: AC | PRN

## 2018-07-30 MED ORDER — ELIQUIS 5 MG VTE STARTER PACK: ORAL_TABLET | ORAL | 0 refills | Status: AC

## 2018-07-30 MED ORDER — FOLIC ACID 1 MG PO TABS: 1.0000 mg | ORAL_TABLET | Freq: Every day | ORAL | 0 refills | Status: AC

## 2018-07-30 MED FILL — traMADol HCL 50 MG TABS: 50 | 10 days supply | Qty: 20 | Fill #0

## 2018-07-30 MED FILL — ELIQUIS STARTER PACK 5 MG T: 5 | 30 days supply | Qty: 74 | Fill #0

## 2018-07-30 MED FILL — THIAMINE HCL 100 MG TABS: 100 | 30 days supply | Qty: 30 | Fill #0

## 2018-07-30 MED FILL — FOLIC ACID 1 MG TABS: 1 | 30 days supply | Qty: 30 | Fill #0

## 2018-07-30 MED FILL — CYCLOBENZAPRINE 10 MG TABLE: 10 | 5 days supply | Qty: 15 | Fill #0

## 2018-07-30 NOTE — Care Management Note (Signed)
Case Management Note Donn PieriniKristi Masud Holub RN, BSN Transitions of Care Unit 4E- RN Case Manager [phone]  Patient Details  Name: Horatio PelJohn Hoogland MRN: [id number] Date of Birth: 05-28-1971  Subjective/Objective:  Pt admitted with Bil PE                  Action/Plan: PTA pt was staying at [facility] (homeless)- CSW consulted for shelter resources. CM spoke with pt and confirmed that pt has [facility] and medication needs- [facility] discharge for Eliquis needs and for assistance in filling for pt assistance- CM provided pt with application for Eliquis pt assistance program [facility]. Pt was assisted with MATCH on his last discharge and is not eligible for pt assistance at this time. He will get 30 day free Eliquis with care- [facility] pharmacy to fill prior to discharge- spoke with pt at bedside to see if he could afford his other meds- per pt he has called his mom who is willing to help with his meds- [facility] of [facility] other scripts will be $11.35- pt voices understanding and wants to gets [facility]. Meds will be delivered to bedside prior to discharge. Per pt. His mom will provide transportation to shelter.   Expected Discharge Date:  07/30/18               Expected Discharge Plan:  Homeless Shelter  In-House Referral:  Clinical Social Work  Discharge planning Services  CM Consult, Medication Assistance, Indigent Health Clinic  Post Acute Care Choice:  NA Choice offered to:  NA  DME Arranged:    DME Agency:     HH Arranged:    HH Agency:     Status of Service:  Completed, signed off  If discussed at [employer] Meetings, dates discussed:    Discharge Disposition: homeless shelter   Additional Comments:  Darrold SpanWebster, Keelin Neville Hall, RN 07/30/2018, 12:43 PM

## 2018-07-30 NOTE — Discharge Instructions (Addendum)
Pulmonary Embolism  A pulmonary embolism (PE) is a sudden blockage or decrease of blood flow in one lung or both lungs. Most blockages come from a blood clot that forms in a lower leg, thigh, or arm vein (deep vein thrombosis, DVT) and travels to the lungs. A clot is blood that has thickened into a gel or solid. PE is a dangerous and life-threatening condition that needs to be treated right away. What are the causes? This condition is usually caused by a blood clot that forms in a vein and moves to the lungs. In rare cases, it may be caused by air, fat, part of a tumor, or other tissue that moves through the veins and into the lungs. What increases the risk? The following factors may make you more likely to develop this condition:  Traumatic injury, such as breaking a hip or leg.  Spinal cord injury.  Orthopedic surgery, especially hip or knee replacement.  Any major surgery.  Stroke.  Having DVT.  Blood clots or blood clotting disease.  Long-term (chronic) lung or heart disease.  Taking medicines that contain estrogen. These include birth control pills and hormone replacement therapy.  Cancer and chemotherapy.  Having a central venous catheter.  Pregnancy and the period of time after delivery (postpartum).  Being older than age 80.  Being overweight.  Smoking. What are the signs or symptoms? Symptoms of this condition usually start suddenly and include:  Shortness of breath during activity or at rest.  Coughing or coughing up blood or blood-tinged mucus.  Chest pain that is often worse with deep breaths.  Rapid or irregular heartbeat.  Feeling light-headed or dizzy.  Fainting.  Feeling anxious.  Fever.  Sweating.  Pain and swelling in a leg. This is a symptom of DVT, which can lead to PE. How is this diagnosed? This condition may be diagnosed based on:  Your medical history.  A physical exam.  Blood tests.  CT pulmonary angiogram. This test  checks blood flow in and around your lungs.  Ventilation-perfusion scan, also called a lung VQ scan. This test measures air flow and blood flow to the lungs.  Ultrasound of the legs. How is this treated? Treatment for this condition depends on many factors, such as the cause of your PE, your risk for bleeding or developing more clots, and other medical conditions you have. Treatment aims to remove, dissolve, or stop blood clots from forming or growing larger. Treatment may include:  Medicines, such as: ? Blood thinning medicines (anticoagulants) to stop clots from forming or growing. ? Medicines that dissolve clots (thrombolytics).  Procedures, such as: ? Using a flexible tube to remove a blood clot (embolectomy) or deliver medicine to destroy it (catheter-directed thrombolysis). ? Inserting a filter into a large vein that carries blood to the heart (inferior vena cava). This filter (vena cava filter) catches blood clots before they reach the lungs. ? Surgery to remove the clot (surgical embolectomy). This is rare. You may need a combination of immediate, long-term (up to 3 months after diagnosis), and extended (more than 3 months after diagnosis) treatments. Your treatment may continue for several months (maintenance therapy). You and your health care provider will work together to choose the treatment program that is best for you. Follow these instructions at home: Medicines  Take over-the-counter and prescription medicines only as told by your health care provider.  If you are taking an anticoagulant medicine: ? Take the medicine every day at the same time each day. ?  Understand what foods and drugs interact with your medicine. ? Understand the side effects of this medicine, including excessive bruising or bleeding. Ask your health care provider or pharmacist about other side effects. General instructions  Wear a medical alert bracelet or carry a medical alert card that says you have  had a PE and lists what medicines you take.  Ask your health care provider when you may return to your normal activities. Avoid sitting or lying for a long time without moving.  Maintain a healthy weight. Ask your health care provider what weight is healthy for you.  Do not use any products that contain nicotine or tobacco, such as cigarettes and e-cigarettes. If you need help quitting, ask your health care provider.  Talk with your health care provider about any travel plans. It is important to make sure that you are still able to take your medicine while on trips.  Keep all follow-up visits as told by your health care provider. This is important. Contact a health care provider if:  You missed a dose of your blood thinner medicine. Get help right away if:  You have: ? New or increased pain, swelling, warmth, or redness in an arm or leg. ? Numbness or tingling in an arm or leg. ? Shortness of breath during activity or at rest. ? A fever. ? Chest pain. ? A rapid or irregular heartbeat. ? A severe headache. ? Vision changes. ? A serious fall or accident, or you hit your head. ? Stomach (abdominal) pain. ? Blood in your vomit, stool, or urine. ? A cut that will not stop bleeding.  You cough up blood.  You feel light-headed or dizzy.  You cannot move your arms or legs.  You are confused or have memory loss. These symptoms may represent a serious problem that is an emergency. Do not wait to see if the symptoms will go away. Get medical help right away. Call your local emergency services (911 in the [place] Do not drive yourself to the hospital. Summary  A pulmonary embolism (PE) is a sudden blockage or decrease of blood flow in one lung or both lungs. PE is a dangerous and life-threatening condition that needs to be treated right away.  Treatments for this condition usually include medicines to thin your blood (anticoagulants) or medicines to break apart blood clots  (thrombolytics).  If you are given blood thinners, it is important to take the medicine every single day at the same time each day.  If you have signs of PE or DVT, call your local emergency services (911 in the [place] This information is not intended to replace advice given to you by your health care provider. Make sure you discuss any questions you have with your health care provider. Document Released: 07/27/2000 Document Revised: 03/14/2018 Document Reviewed: 09/12/2017 Elsevier Interactive Patient Education  2019 [employer] Information on my medicine - ELIQUIS (apixaban)  This medication education was reviewed with me or my healthcare representative as part of my discharge preparation.    Why was Eliquis prescribed for you? Eliquis was prescribed to treat blood clots that may have been found in the veins of your legs (deep vein thrombosis) or in your lungs (pulmonary embolism) and to reduce the risk of them occurring again.  What do You need to know about Eliquis ? The starting dose is 10 mg (two 5 mg tablets) taken TWICE daily for the FIRST SEVEN (7) DAYS, then on 08/05/18  the dose is reduced to ONE  5 mg tablet taken TWICE daily.  Eliquis may be taken with or without food.   Try to take the dose about the same time in the morning and in the evening. If you have difficulty swallowing the tablet whole please discuss with your pharmacist how to take the medication safely.  Take Eliquis exactly as prescribed and DO NOT stop taking Eliquis without talking to the doctor who prescribed the medication.  Stopping may increase your risk of developing a new blood clot.  Refill your prescription before you run out.  After discharge, you should have regular check-up appointments with your healthcare provider that is prescribing your Eliquis.    What do you do if you miss a dose? If a dose of ELIQUIS is not taken at the scheduled time, take it as soon as possible on the same day and  twice-daily administration should be resumed. The dose should not be doubled to make up for a missed dose.  Important Safety Information A possible side effect of Eliquis is bleeding. You should call your healthcare provider right away if you experience any of the following: ? Bleeding from an injury or your nose that does not stop. ? Unusual colored urine (red or dark brown) or unusual colored stools (red or black). ? Unusual bruising for unknown reasons. ? A serious fall or if you hit your head (even if there is no bleeding).  Some medicines may interact with Eliquis and might increase your risk of bleeding or clotting while on Eliquis. To help avoid this, consult your healthcare provider or pharmacist prior to using any new prescription or non-prescription medications, including herbals, vitamins, non-steroidal anti-inflammatory drugs (NSAIDs) and supplements.  This website has more information on Eliquis (apixaban): http://www.eliquis.com/eliquis/home

## 2018-07-30 NOTE — Progress Notes (Signed)
Discharge instructions reviewed with patient and his mother. They deny questions. He has prescriptions provided by inpatient pharmacy and is discharged in stable condition via wheelchair to family vehicle.
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
## 2018-07-30 NOTE — Plan of Care (Signed)
  Problem: Education: Goal: Knowledge of General Education information will improve Description: Including pain rating scale, medication(s)/side effects and non-pharmacologic comfort measures Outcome: Adequate for Discharge   Problem: Health Behavior/Discharge Planning: Goal: Ability to manage health-related needs will improve Outcome: Adequate for Discharge   Problem: Clinical Measurements: Goal: Ability to maintain clinical measurements within normal limits will improve Outcome: Adequate for Discharge Goal: Will remain free from infection Outcome: Adequate for Discharge Goal: Diagnostic test results will improve Outcome: Adequate for Discharge Goal: Respiratory complications will improve Outcome: Adequate for Discharge Goal: Cardiovascular complication will be avoided Outcome: Adequate for Discharge   Problem: Activity: Goal: Risk for activity intolerance will decrease Outcome: Adequate for Discharge   Problem: Coping: Goal: Level of anxiety will decrease Outcome: Adequate for Discharge   Problem: Pain Managment: Goal: General experience of comfort will improve Outcome: Adequate for Discharge   Problem: Safety: Goal: Ability to remain free from injury will improve Outcome: Adequate for Discharge   Problem: Skin Integrity: Goal: Risk for impaired skin integrity will decrease Outcome: Adequate for Discharge   

## 2018-07-31 LAB — CULTURE, BLOOD (ROUTINE X 2)
Culture: NO GROWTH
Culture: NO GROWTH
Special Requests: ADEQUATE

## 2019-02-11 DEATH — deceased

## 2020-02-06 IMAGING — CR DG CHEST 2V
2 series · 2 of 2 positions shown · non-contrast
Comparison: 01/21/2018

CLINICAL DATA: Cough.  Rash.

EXAM:
CHEST - 2 VIEW

[w chest pa]
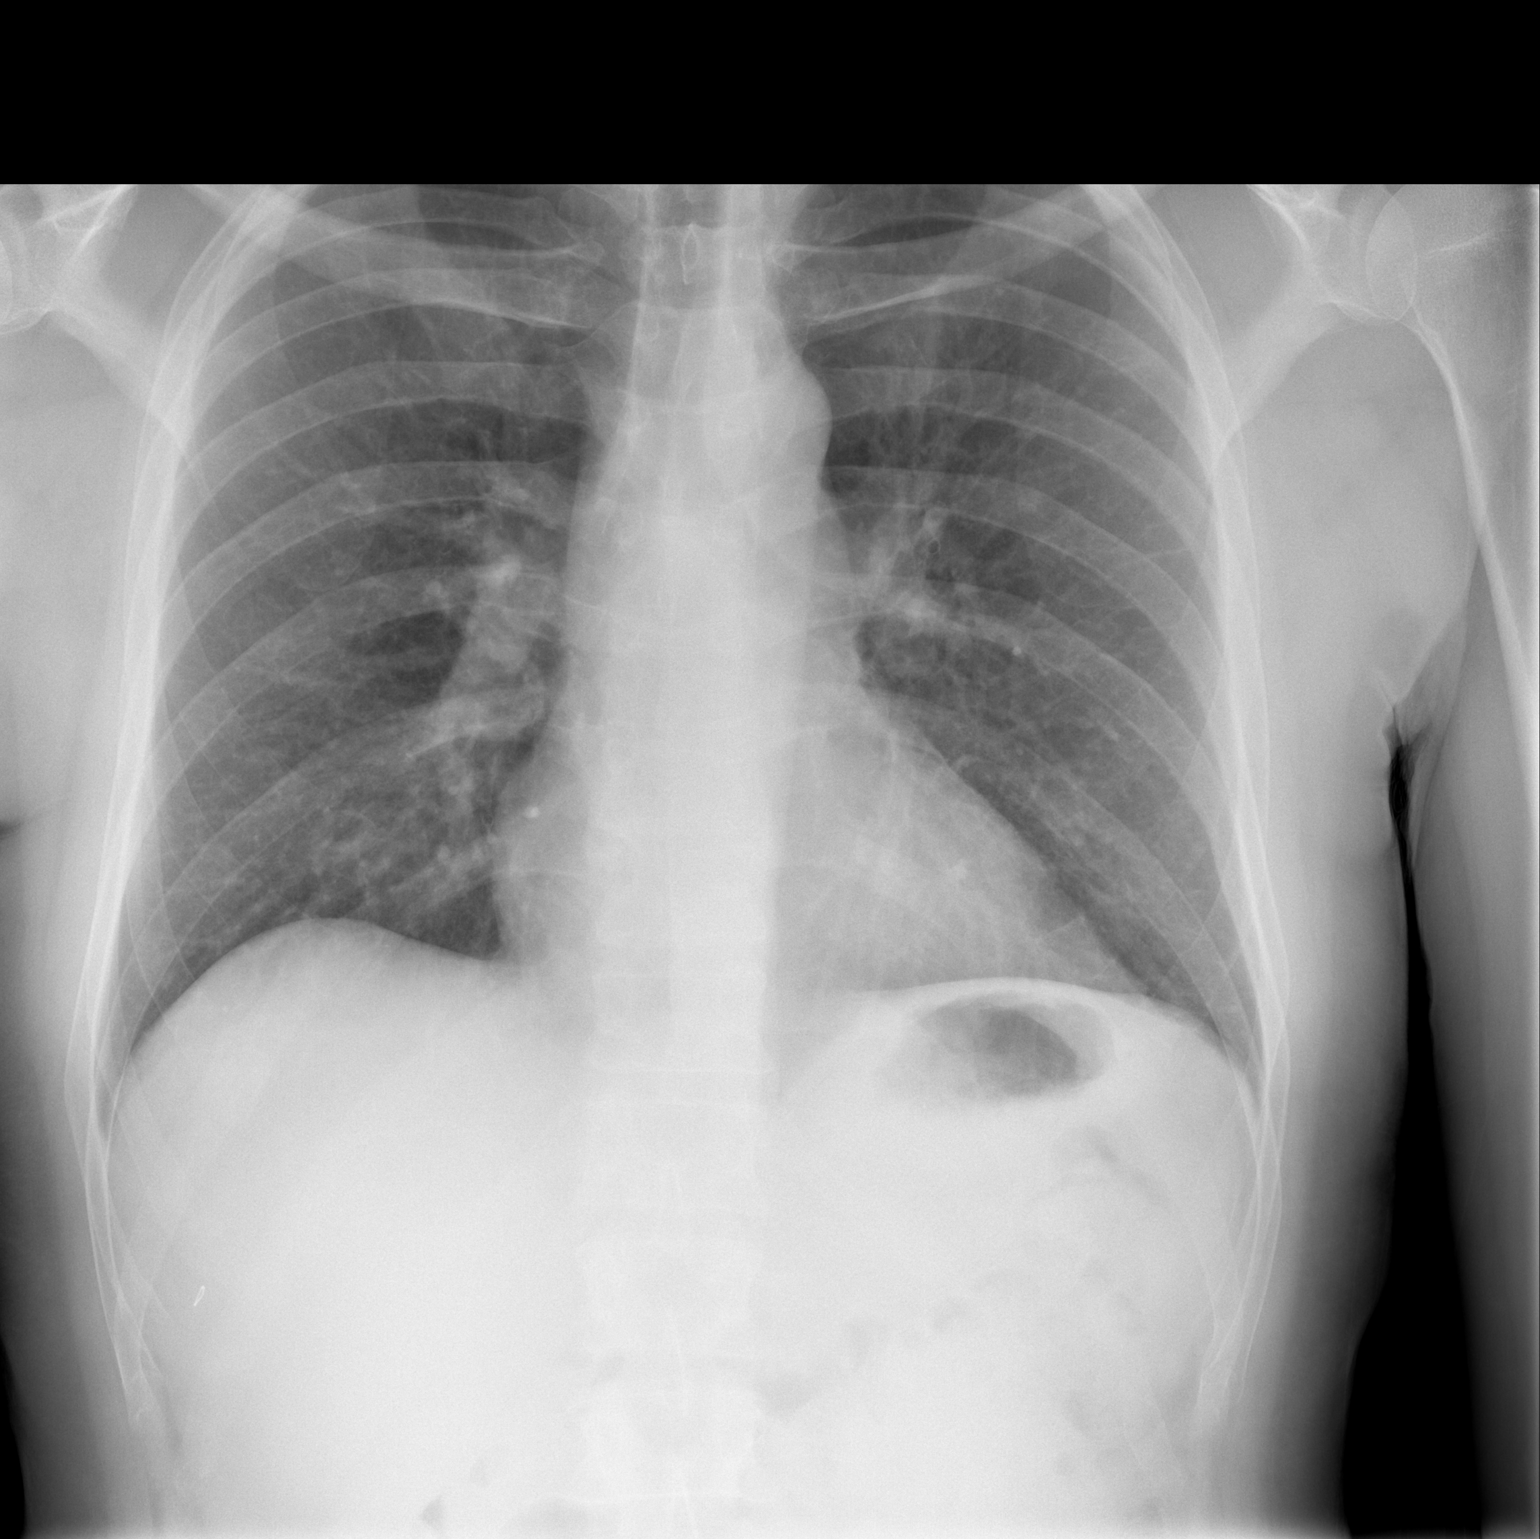

[w chest lat]
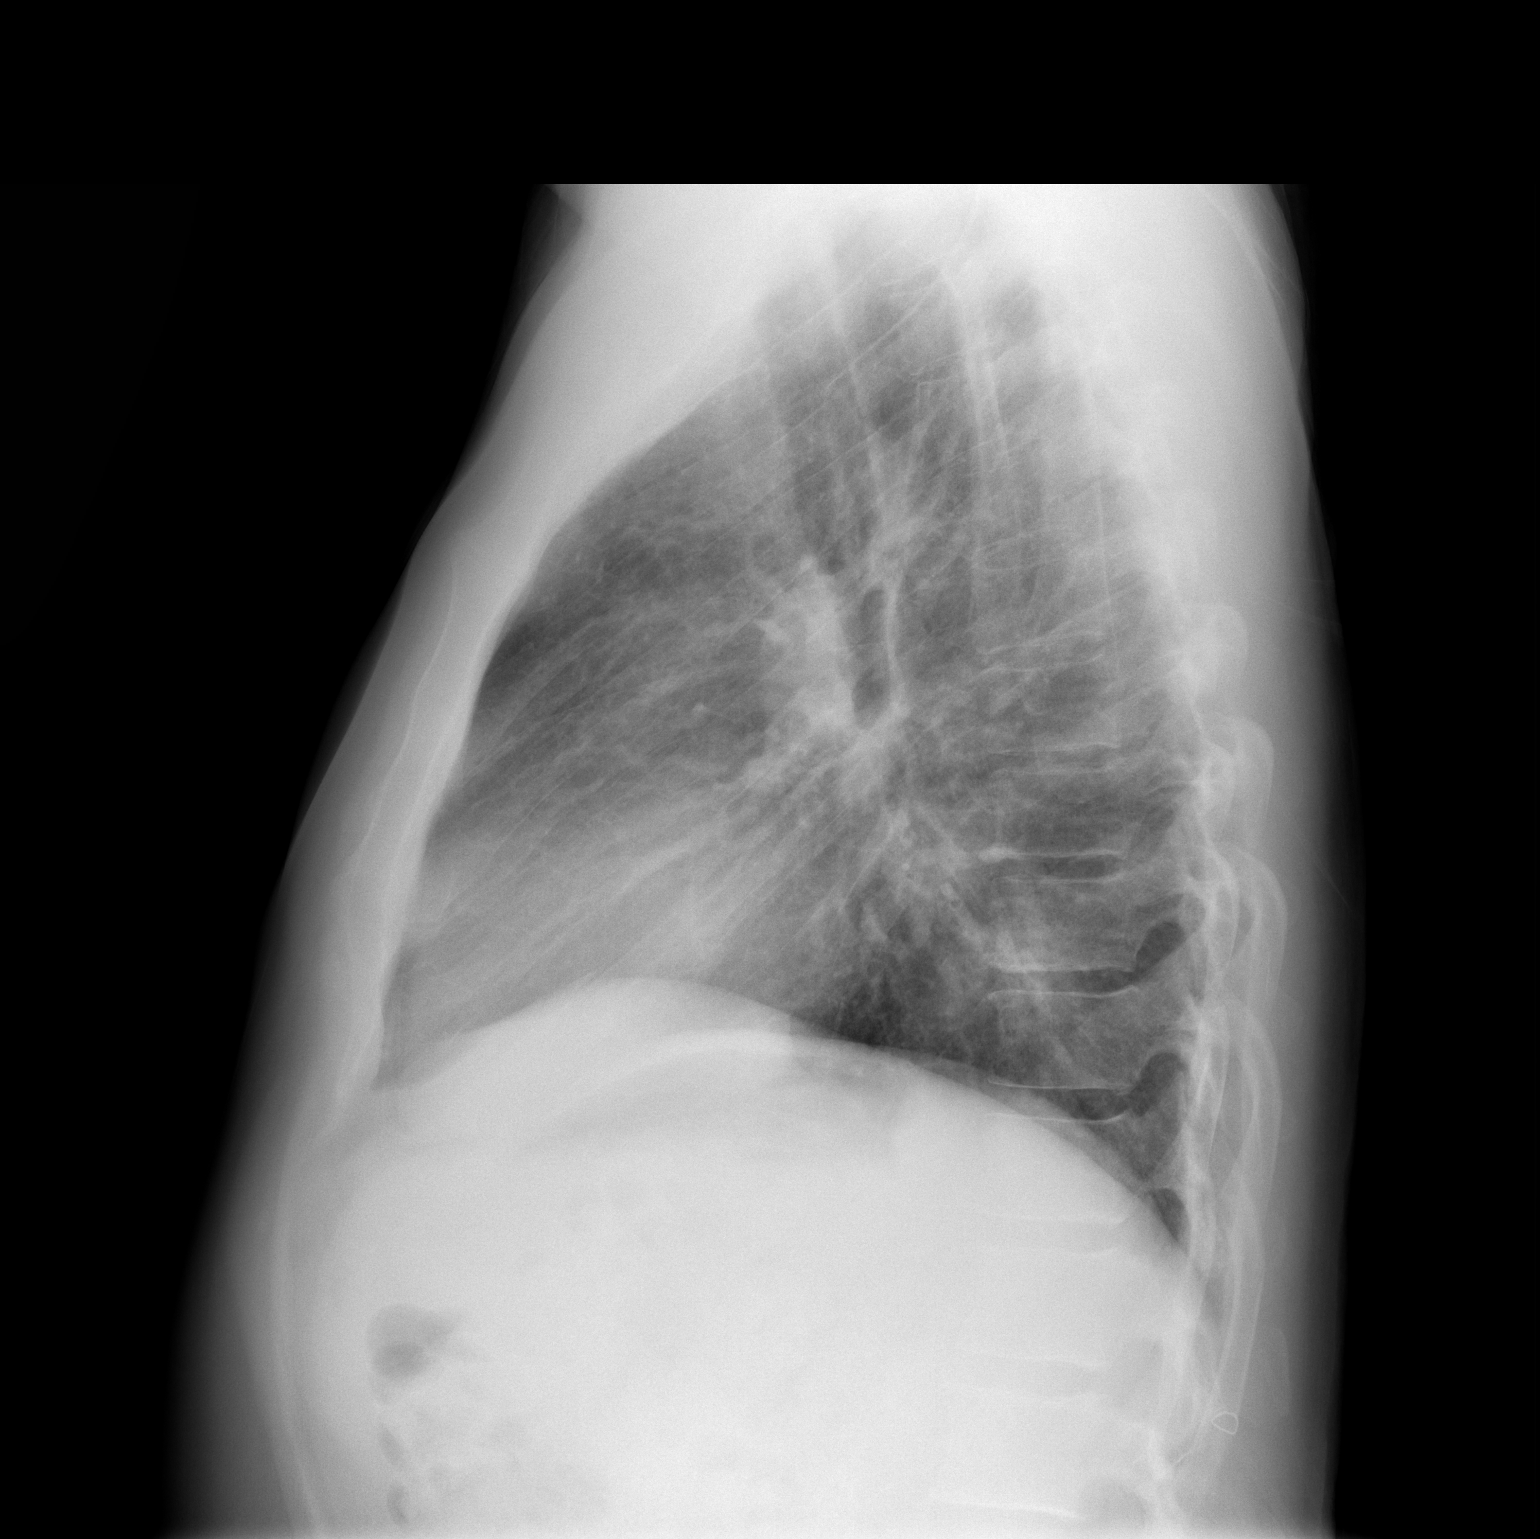

[2 of 2 positions shown; findings below may reference images not displayed]

FINDINGS: The heart size and mediastinal contours are within normal limits.
Both lungs are clear except for slight peribronchial thickening on
the lateral view. The visualized skeletal structures are
unremarkable.
IMPRESSION: Mild bronchitic changes.
